# Patient Record
Sex: Female | Born: 1995 | Hispanic: Yes | Marital: Single | Smoking: Never smoker
Health system: Southern US, Community
[De-identification: ages and names within clinical notes are randomized; demographics above are authoritative.]

## PROBLEM LIST (undated history)

## (undated) HISTORY — PX: NO PAST SURGERIES: SHX2092

---

## 2020-10-24 ENCOUNTER — Inpatient Hospital Stay (HOSPITAL_COMMUNITY): Payer: Medicaid Other

## 2020-10-24 ENCOUNTER — Inpatient Hospital Stay (HOSPITAL_COMMUNITY)
Admission: AD | Admit: 2020-10-24 | Discharge: 2020-10-24 | Disposition: A | Discharge status: Home / Self Care | Payer: Medicaid Other | Attending: Obstetrics and Gynecology | Admitting: Obstetrics and Gynecology

## 2020-10-24 ENCOUNTER — Other Ambulatory Visit: Payer: Self-pay

## 2020-10-24 ENCOUNTER — Encounter (HOSPITAL_COMMUNITY): Payer: Self-pay | Admitting: Obstetrics and Gynecology

## 2020-10-24 DIAGNOSIS — O209 Hemorrhage in early pregnancy, unspecified: Secondary | ICD-10-CM

## 2020-10-24 DIAGNOSIS — O208 Other hemorrhage in early pregnancy: Secondary | ICD-10-CM | POA: Diagnosis not present

## 2020-10-24 DIAGNOSIS — Z3A12 12 weeks gestation of pregnancy: Secondary | ICD-10-CM | POA: Insufficient documentation

## 2020-10-24 DIAGNOSIS — O3680X0 Pregnancy with inconclusive fetal viability, not applicable or unspecified: Secondary | ICD-10-CM

## 2020-10-24 DIAGNOSIS — O4691 Antepartum hemorrhage, unspecified, first trimester: Secondary | ICD-10-CM | POA: Diagnosis not present

## 2020-10-24 LAB — CBC
HCT: 45.3 % (ref 36.0–46.0)
Hemoglobin: 15.1 g/dL — ABNORMAL HIGH (ref 12.0–15.0)
MCH: 30.7 pg (ref 26.0–34.0)
MCHC: 33.3 g/dL (ref 30.0–36.0)
MCV: 92.1 fL (ref 80.0–100.0)
Platelets: 261 10*3/uL (ref 150–400)
RBC: 4.92 MIL/uL (ref 3.87–5.11)
RDW: 13.2 % (ref 11.5–15.5)
WBC: 9.5 10*3/uL (ref 4.0–10.5)
nRBC: 0 % (ref 0.0–0.2)

## 2020-10-24 LAB — ABO/RH: ABO/RH(D): O POS

## 2020-10-24 LAB — HCG, QUANTITATIVE, PREGNANCY: hCG, Beta Chain, Quant, S: 1774 m[IU]/mL — ABNORMAL HIGH

## 2020-10-24 LAB — POCT PREGNANCY, URINE: Preg Test, Ur: POSITIVE — AB

## 2020-10-24 NOTE — MAU Provider Note (Signed)
History     CSN: [id number]  Arrival date and time: 10/24/20 1250  Event Date/Time  First Provider Initiated Contact with Patient 10/24/20 1715      Chief Complaint  Patient presents with   Vaginal Bleeding   HPI Robin Zhang is a 25 y.o. G1P0 at [redacted]w[redacted]d by certain LMP who presents to MAU with chief complaint of vaginal bleeding. This is a new problem, onset today. Patient reports seeing blood fill up her toilet when she voided. She denies ongoing vaginal bleeding but continued to see blood on her toilet paper. Pain score 0/10. She is remote from sexual intercourse.  OB History     Gravida  1   Para      Term      Preterm      AB      Living         SAB      IAB      Ectopic      Multiple      Live Births              Allergies: Not on File  No medications prior to admission.    Review of Systems  Genitourinary:  Positive for vaginal bleeding.  All other systems reviewed and are negative. Physical Exam   Blood pressure (!) 112/57, pulse 93, temperature 98 F (36.7 C), temperature source Oral, resp. rate 16, height 5\' 3"  (1.6 m), weight 57.3 kg, last menstrual period 07/31/2020, SpO2 99 %.  Physical Exam Vitals and nursing note reviewed. Exam conducted with a chaperone present.  Constitutional:      General: She is not in acute distress.    Appearance: Normal appearance. She is not ill-appearing.  Cardiovascular:     Rate and Rhythm: Normal rate.     Pulses: Normal pulses.     Heart sounds: Normal heart sounds.  Pulmonary:     Effort: Pulmonary effort is normal.     Breath sounds: Normal breath sounds.  Abdominal:     Tenderness: There is no abdominal tenderness.  Skin:    Capillary Refill: Capillary refill takes less than 2 seconds.  Neurological:     Mental Status: She is alert and oriented to person, place, and time.  Psychiatric:        Mood and Affect: Mood normal.        Behavior: Behavior normal.        Thought Content: Thought  content normal.        Judgment: Judgment normal.    MAU Course  Procedures  Orders Placed This Encounter  Procedures   09/30/2020 OB Comp Less 14 Wks   US OB Transvaginal   US OB Transvaginal   CBC   hCG, quantitative, pregnancy   Pregnancy, urine POC   ABO/Rh   Discharge patient   Patient Vitals for the past 24 hrs:  BP Temp Temp src Pulse Resp SpO2 Height Weight  10/24/20 1326 (!) 112/57 98 F (36.7 C) Oral 93 16 99 % -- --  10/24/20 1321 -- -- -- -- -- -- 5\' 3"  (1.6 m) 57.3 kg   Results for orders placed or performed during the hospital encounter of 10/24/20 (from the past 24 hour(s))  Pregnancy, urine POC     Status: Abnormal   Collection Time: 10/24/20  1:23 PM  Result Value Ref Range   Preg Test, Ur POSITIVE (A) NEGATIVE  CBC     Status: Abnormal   Collection Time: 10/24/20  1:48 PM  Result Value Ref Range   WBC 9.5 4.0 - 10.5 K/uL   RBC 4.92 3.87 - 5.11 MIL/uL   Hemoglobin 15.1 (H) 12.0 - 15.0 g/dL   HCT 44.0 34.7 - 42.5 %   MCV 92.1 80.0 - 100.0 fL   MCH 30.7 26.0 - 34.0 pg   MCHC 33.3 30.0 - 36.0 g/dL   RDW 95.6 38.7 - 56.4 %   Platelets 261 150 - 400 K/uL   nRBC 0.0 0.0 - 0.2 %  hCG, quantitative, pregnancy     Status: Abnormal   Collection Time: 10/24/20  1:48 PM  Result Value Ref Range   hCG, Beta Chain, Quant, S 1,774 (H) <5 mIU/mL  ABO/Rh     Status: None   Collection Time: 10/24/20  1:48 PM  Result Value Ref Range   ABO/RH(D) O POS    No rh immune globuloin      NOT A RH IMMUNE GLOBULIN CANDIDATE, PT RH POSITIVE Performed at [facility], [address] [place], [place] [address]    [place] Comp Less 14 Wks  Result Date: 10/24/2020 CLINICAL DATA:  Vaginal bleeding in 1st trimester pregnancy. EXAM: OBSTETRIC <14 WK [place] AND TRANSVAGINAL OB US TECHNIQUE: Both transabdominal and transvaginal ultrasound examinations were performed for complete evaluation of the gestation as well as the maternal uterus, adnexal regions, and pelvic cul-de-sac. Transvaginal  technique was performed to assess early pregnancy. COMPARISON:  None. FINDINGS: Intrauterine gestational sac: Single, with irregular shape and location in lower uterine segment noted Yolk sac:  Visualized. Embryo:  Visualized. Cardiac Activity: Not Visualized. CRL:  6 mm   6 w   2 d                  [place] EDC: 06/17/2021 Subchorionic hemorrhage:  Small subchorionic hemorrhage seen. Maternal uterus/adnexae: Uterus is retroflexed. Both ovaries are normal in appearance. No mass or abnormal free fluid identified. IMPRESSION: Findings are suspicious but not yet definitive for failed pregnancy. Recommend follow-up US in 7 days for definitive diagnosis. This recommendation follows SRU consensus guidelines: Diagnostic Criteria for Nonviable Pregnancy Early in the First Trimester. Malva Limes Med 2013; 188:4166-06. Electronically Signed   By: Danae Orleans M.D.   On: 10/24/2020 16:59   US OB Transvaginal  Result Date: 10/24/2020 CLINICAL DATA:  Vaginal bleeding in 1st trimester pregnancy. EXAM: OBSTETRIC <14 WK [place] AND TRANSVAGINAL OB US TECHNIQUE: Both transabdominal and transvaginal ultrasound examinations were performed for complete evaluation of the gestation as well as the maternal uterus, adnexal regions, and pelvic cul-de-sac. Transvaginal technique was performed to assess early pregnancy. COMPARISON:  None. FINDINGS: Intrauterine gestational sac: Single, with irregular shape and location in lower uterine segment noted Yolk sac:  Visualized. Embryo:  Visualized. Cardiac Activity: Not Visualized. CRL:  6 mm   6 w   2 d                  [place] EDC: 06/17/2021 Subchorionic hemorrhage:  Small subchorionic hemorrhage seen. Maternal uterus/adnexae: Uterus is retroflexed. Both ovaries are normal in appearance. No mass or abnormal free fluid identified. IMPRESSION: Findings are suspicious but not yet definitive for failed pregnancy. Recommend follow-up US in 7 days for definitive diagnosis. This recommendation follows SRU consensus  guidelines: Diagnostic Criteria for Nonviable Pregnancy Early in the First Trimester. Malva Limes Med 2013; 301:6010-93. Electronically Signed   By: Danae Orleans M.D.   On: 10/24/2020 16:59    Assessment and Plan  --  26 y.o. G1P0 at [redacted]w[redacted]d --Concern for pregnancy failure --Hgb 15.1 --Quant hCG 1774 --Blood type O POS --Discharge home in stable condition with bleeding precautions  F/U: --Message sent to [facility] to schedule repeat viability scan in one week  Calvert Cantor, CNM 10/24/2020, 8:05 PM

## 2020-10-24 NOTE — MAU Note (Signed)
Robin Zhang is a 25 y.o. at [redacted]w[redacted]d here in MAU reporting: this AM when she woke up she saw some bleeding in the toilet bowl. Did not see any bleeding when using the bathroom in MAU. No pain. No abnormal discharge. Last IC was last night.  LMP: 07/31/20  Onset of complaint: today  Pain score: 0/10  Vitals:   10/24/20 1326  BP: (!) 112/57  Pulse: 93  Resp: 16  Temp: 98 F (36.7 C)  SpO2: 99%     EEF:[id number]  Lab orders placed from triage: upt

## 2020-11-01 ENCOUNTER — Ambulatory Visit
Admission: RE | Admit: 2020-11-01 | Discharge: 2020-11-01 | Disposition: A | Discharge status: Home / Self Care | Payer: Medicaid Other | Source: Ambulatory Visit | Attending: Advanced Practice Midwife | Admitting: Advanced Practice Midwife

## 2020-11-01 ENCOUNTER — Ambulatory Visit (INDEPENDENT_AMBULATORY_CARE_PROVIDER_SITE_OTHER): Payer: Self-pay | Admitting: Family Medicine

## 2020-11-01 ENCOUNTER — Other Ambulatory Visit: Payer: Self-pay

## 2020-11-01 ENCOUNTER — Other Ambulatory Visit (HOSPITAL_COMMUNITY): Payer: Self-pay

## 2020-11-01 VITALS — BP 93/68 | HR 87 | Temp 97.6°F | Wt 125.9 lb

## 2020-11-01 DIAGNOSIS — O3680X0 Pregnancy with inconclusive fetal viability, not applicable or unspecified: Secondary | ICD-10-CM | POA: Insufficient documentation

## 2020-11-01 DIAGNOSIS — O021 Missed abortion: Secondary | ICD-10-CM

## 2020-11-01 MED ORDER — OXYCODONE-ACETAMINOPHEN 5-325 MG PO TABS
1.0000 | ORAL_TABLET | ORAL | 0 refills | Status: DC | PRN
  Filled 2020-11-01: qty 20, 4d supply, fill #0

## 2020-11-01 MED ORDER — MISOPROSTOL 200 MCG PO TABS
ORAL_TABLET | ORAL | 1 refills | Status: DC
  Filled 2020-11-01: qty 4, 1d supply, fill #0

## 2020-11-01 NOTE — Progress Notes (Signed)
Reports vaginal bleeding like a period for 5 days following visit to MAU; bleeding for less 3 days has been lighter. Denies any pain since MAU visit.   Fleet Contras RN 11/01/20

## 2020-11-01 NOTE — Progress Notes (Signed)
   Subjective:    Patient ID: Robin Zhang, female    DOB: 03-02-1996, 25 y.o.   MRN: [id number]  HPI Patient seen for follow up US. Her last [place] was last week and showed a embryo without a heartbeat. She did have some bleeding like a period a few days ago, then no further bleeding.   Review of Systems     Objective:   Physical Exam Vitals reviewed.  Constitutional:      Appearance: Normal appearance.  Cardiovascular:     Rate and Rhythm: Normal rate.  Pulmonary:     Effort: Pulmonary effort is normal.  Abdominal:     General: Abdomen is flat.     Palpations: Abdomen is soft.  Neurological:     Mental Status: She is alert.  Psychiatric:        Mood and Affect: Mood normal.        Behavior: Behavior normal.        Thought Content: Thought content normal.        Judgment: Judgment normal.       Assessment & Plan:   1. Missed abortion Discussed results with the patient and next steps. Will prescribe cytotec. Discussed how to take, when to seek care. May repeat in 24-48 hours. F/u in 2 weeks.

## 2020-11-15 ENCOUNTER — Encounter: Payer: Self-pay | Admitting: Obstetrics and Gynecology

## 2020-11-15 ENCOUNTER — Ambulatory Visit: Payer: Self-pay | Admitting: Obstetrics and Gynecology

## 2020-11-21 ENCOUNTER — Encounter: Payer: Self-pay | Admitting: Nurse Practitioner

## 2020-11-21 ENCOUNTER — Other Ambulatory Visit: Payer: Self-pay

## 2020-11-21 ENCOUNTER — Ambulatory Visit (INDEPENDENT_AMBULATORY_CARE_PROVIDER_SITE_OTHER): Payer: Medicaid Other | Admitting: Nurse Practitioner

## 2020-11-21 VITALS — BP 97/65 | HR 91 | Ht 62.0 in | Wt 126.7 lb

## 2020-11-21 DIAGNOSIS — Z3202 Encounter for pregnancy test, result negative: Secondary | ICD-10-CM

## 2020-11-21 DIAGNOSIS — N939 Abnormal uterine and vaginal bleeding, unspecified: Secondary | ICD-10-CM

## 2020-11-21 DIAGNOSIS — O039 Complete or unspecified spontaneous abortion without complication: Secondary | ICD-10-CM | POA: Diagnosis not present

## 2020-11-21 LAB — POCT PREGNANCY, URINE: Preg Test, Ur: POSITIVE — AB

## 2020-11-21 NOTE — Progress Notes (Signed)
GYNECOLOGY OFFICE VISIT NOTE   History:  25 y.o. G1P0 here today for follow up for recent miscarriage.  She took Cytotec around 11-02-20 and reports having a large piece of tissue come out.  She has also had unprotected intercourse several times.  She stopped bleeding and then continues to have bleeding again.  She is wondering when the bleeding will stop.  Marland KitchenNo past medical history on file.  The following portions of the patient's history were reviewed and updated as appropriate: allergies, current medications, past family history, past medical history, past social history, past surgical history and problem list.      Review of Systems:  Pertinent items noted in HPI and remainder of comprehensive ROS otherwise negative.  Objective:  Physical Exam BP 97/65   Pulse 91   Ht 5\' 2"  (1.575 m)   Wt 126 lb 11.2 oz (57.5 kg)   LMP 07/31/2020 Comment: SAB  Breastfeeding Unknown   BMI 23.17 kg/m  CONSTITUTIONAL: Well-developed, well-nourished female in no acute distress.  HENT:  Normocephalic, atraumatic. External right and left ear normal.  EYES: Conjunctivae and EOM are normal. Pupils are equal, round.  No scleral icterus.  NECK: Normal range of motion, supple, no masses SKIN: Skin is warm and dry. No rash noted. Not diaphoretic. No erythema. No pallor. NEUROLOGIC: Alert and oriented to person, place, and time. Normal muscle tone coordination. No cranial nerve deficit noted. PSYCHIATRIC: Normal mood and affect. Normal behavior. Normal judgment and thought content. CARDIOVASCULAR: Normal heart rate noted RESPIRATORY: Effort and breath sounds normal, no problems with respiration noted ABDOMEN: Soft, no distention noted.   PELVIC: Deferred MUSCULOSKELETAL: Normal range of motion. No edema noted.  Labs and Imaging 09/30/2020 OB Comp Less 14 Wks  Result Date: 10/24/2020 CLINICAL DATA:  Vaginal bleeding in 1st trimester pregnancy. EXAM: OBSTETRIC <14 WK 12/25/2020 AND TRANSVAGINAL OB US TECHNIQUE: Both  transabdominal and transvaginal ultrasound examinations were performed for complete evaluation of the gestation as well as the maternal uterus, adnexal regions, and pelvic cul-de-sac. Transvaginal technique was performed to assess early pregnancy. COMPARISON:  None. FINDINGS: Intrauterine gestational sac: Single, with irregular shape and location in lower uterine segment noted Yolk sac:  Visualized. Embryo:  Visualized. Cardiac Activity: Not Visualized. CRL:  6 mm   6 w   2 d                  [place] EDC: 06/17/2021 Subchorionic hemorrhage:  Small subchorionic hemorrhage seen. Maternal uterus/adnexae: Uterus is retroflexed. Both ovaries are normal in appearance. No mass or abnormal free fluid identified. IMPRESSION: Findings are suspicious but not yet definitive for failed pregnancy. Recommend follow-up 06/19/2021 in 7 days for definitive diagnosis. This recommendation follows SRU consensus guidelines: Diagnostic Criteria for Nonviable Pregnancy Early in the First Trimester. [place] Med [id number]. Electronically Signed   By: ; [id number] M.D.   On: 10/24/2020 16:59   12/25/2020 OB Transvaginal  Result Date: 11/01/2020 CLINICAL DATA:  First trimester pregnancy, assessment of viability EXAM: TRANSVAGINAL OB ULTRASOUND TECHNIQUE: Transvaginal ultrasound was performed for complete evaluation of the gestation as well as the maternal uterus, adnexal regions, and pelvic cul-de-sac. COMPARISON:  10/24/2020 FINDINGS: Intrauterine gestational sac: Present, single, markedly irregular, located at lower uterine segment and containing scattered internal echoes and question debris Yolk sac:  Absent Embryo:  Not definitely visualize Cardiac Activity: N/A Heart Rate: N/A bpm MSD: 18.0 mm   6 w   5 d CRL:     mm  w  d                  [place] EDC: Subchorionic hemorrhage:  Moderate subchorionic hemorrhage Maternal uterus/adnexae: RIGHT ovary normal size and morphology, 3.4 x 1.7 x 2.1 cm. LEFT ovary not visualized. Uterus retroflexed, patient  with prior Caesarean section but scar not definitely localized. No free pelvic fluid or adnexal masses. IMPRESSION: Irregular gestational sac seen within the uterus at the lower uterine segment, containing scattered internal echoes/debris. Fetal pole seen on the previous exam is not definitely visualized, question now absent versus obscured by material within gestational sac. Moderate subchorionic hemorrhage. Gestational sac is located near the expected position of the Caesarean section scar, though scar is not well visualized; recommend attention on follow-up imaging to exclude scar ectopic pregnancy. Findings are suspicious but not yet definitive for failed pregnancy. Recommend follow-up US in 10-14 days for definitive diagnosis. This recommendation follows SRU consensus guidelines: Diagnostic Criteria for Nonviable Pregnancy Early in the First Trimester. Malva Limes Med 2013; 536:6440-34. Electronically Signed   By: Ulyses Southward M.D.   On: 11/01/2020 14:40   US OB Transvaginal  Result Date: 10/24/2020 CLINICAL DATA:  Vaginal bleeding in 1st trimester pregnancy. EXAM: OBSTETRIC <14 WK [place] AND TRANSVAGINAL OB US TECHNIQUE: Both transabdominal and transvaginal ultrasound examinations were performed for complete evaluation of the gestation as well as the maternal uterus, adnexal regions, and pelvic cul-de-sac. Transvaginal technique was performed to assess early pregnancy. COMPARISON:  None. FINDINGS: Intrauterine gestational sac: Single, with irregular shape and location in lower uterine segment noted Yolk sac:  Visualized. Embryo:  Visualized. Cardiac Activity: Not Visualized. CRL:  6 mm   6 w   2 d                  [place] EDC: 06/17/2021 Subchorionic hemorrhage:  Small subchorionic hemorrhage seen. Maternal uterus/adnexae: Uterus is retroflexed. Both ovaries are normal in appearance. No mass or abnormal free fluid identified. IMPRESSION: Findings are suspicious but not yet definitive for failed pregnancy. Recommend  follow-up US in 7 days for definitive diagnosis. This recommendation follows SRU consensus guidelines: Diagnostic Criteria for Nonviable Pregnancy Early in the First Trimester. Malva Limes Med 2013; 742:5956-38. Electronically Signed   By: Danae Orleans M.D.   On: 10/24/2020 16:59    Assessment & Plan:  1. Miscarriage Took cytotec as ordered and reports passing a clump of tissue different from bleeding.  Has continued to have unprotected intercourse since the miscarriage Did a urine pregnancy test today that was faintly positive at approx 3 weeks post cytotec.  Chart reports she no insurance so urine pregnancy test (over quant) was done as she was still having periodic bleeding sometimes after intercourse.   Discussed that urine pregnancy test was faintly positive and she has no pain today.  Thinking this is the pregnancy still resolving (quant on 10-24-20 was 1700).   Advised to return if having pain.  Unlikely a new pregnancy that would be ectopic at this time, but will need evaluation if having pain. Advised condoms if having intercourse. Was taking pills before and wants to restart pills.  With uncertainty regarding whether she is newly pregnant after miscarriage - not likely but has been having unprotected intercourse - will wait until next visit to restart pills.  2. Vaginal bleeding  - Pregnancy, urine   Routine preventative health maintenance measures emphasized. Please refer to After Visit Summary for other counseling recommendations.   Return in about 2 weeks (  around 12/05/2020) for Provider visit for urine pregnancy test and birth control pill prescription.   Total face-to-face time with patient:  10  minutes.  Over 50% of encounter was spent on counseling and coordination of care.  Nolene Bernheim, RN, MSN, NP-BC Nurse Practitioner, [facility] for [employer], [facility] 11/21/2020 2:43 PM

## 2020-11-21 NOTE — Progress Notes (Signed)
Pt states is still having some light to moderate bleeding.

## 2020-12-11 ENCOUNTER — Other Ambulatory Visit (HOSPITAL_COMMUNITY): Payer: Self-pay

## 2020-12-11 ENCOUNTER — Other Ambulatory Visit: Payer: Self-pay

## 2020-12-11 ENCOUNTER — Ambulatory Visit (INDEPENDENT_AMBULATORY_CARE_PROVIDER_SITE_OTHER): Payer: Medicaid Other | Admitting: Obstetrics & Gynecology

## 2020-12-11 VITALS — BP 100/75 | HR 81 | Wt 128.5 lb

## 2020-12-11 DIAGNOSIS — O039 Complete or unspecified spontaneous abortion without complication: Secondary | ICD-10-CM | POA: Diagnosis not present

## 2020-12-11 DIAGNOSIS — Z30011 Encounter for initial prescription of contraceptive pills: Secondary | ICD-10-CM

## 2020-12-11 LAB — POCT PREGNANCY, URINE: Preg Test, Ur: NEGATIVE

## 2020-12-11 MED ORDER — NORGESTIM-ETH ESTRAD TRIPHASIC 0.18/0.215/0.25 MG-25 MCG PO TABS
1.0000 | ORAL_TABLET | Freq: Every day | ORAL | 11 refills | Status: DC
  Filled 2020-12-11: qty 28, 28d supply, fill #0

## 2020-12-11 NOTE — Progress Notes (Signed)
Patient ID: Robin Zhang, female   DOB: [birth year], 25 y.o.   MRN: [id number]  Chief Complaint  Patient presents with   Follow-up    HPI Robin Zhang is a 25 y.o. female.  F/u for OCP after having miscarriage and treatment with Cytotec. She still has intermittent bleeding with clots HPI  No past medical history on file.  No past surgical history on file.  No family history on file.  Social History    Not on File  Current Outpatient Medications  Medication Sig Dispense Refill   Norgestimate-Ethinyl Estradiol Triphasic 0.18/0.215/0.25 MG-25 MCG tab Take 1 tablet by mouth daily. 28 tablet 11   No current facility-administered medications for this visit.    Review of Systems Review of Systems  Constitutional: Negative.   Gastrointestinal: Negative.   Genitourinary:  Positive for vaginal bleeding. Negative for pelvic pain.   Blood pressure 100/75, pulse 81, weight 128 lb 8 oz (58.3 kg), last menstrual period 07/31/2020, not currently breastfeeding.  Physical Exam Physical Exam Vitals and nursing note reviewed.  Constitutional:      Appearance: Normal appearance.  Pulmonary:     Effort: Pulmonary effort is normal.  Abdominal:     General: Abdomen is flat.     Palpations: Abdomen is soft.  Neurological:     Mental Status: She is alert.  Psychiatric:        Mood and Affect: Mood normal.        Behavior: Behavior normal.    Data Reviewed [place] results and office notes  Assessment Probable s/p complete Sab but still has DUB  Plan F/u pelvic US to check for retained POC Rx TriCyclen given    Scheryl Darter 12/11/2020, 10:03 AM

## 2020-12-11 NOTE — Progress Notes (Signed)
Patient is here for follow up from miscarriage that happened July 5th, 2022. Patient stated that she is not in any pain but she is still bleeding. Ms. Zechman would like to start birth control pills today.

## 2020-12-18 ENCOUNTER — Other Ambulatory Visit: Payer: Self-pay

## 2020-12-18 ENCOUNTER — Ambulatory Visit (INDEPENDENT_AMBULATORY_CARE_PROVIDER_SITE_OTHER): Payer: Medicaid Other | Admitting: General Practice

## 2020-12-18 ENCOUNTER — Ambulatory Visit
Admission: RE | Admit: 2020-12-18 | Discharge: 2020-12-18 | Disposition: A | Discharge status: Home / Self Care | Payer: Medicaid Other | Source: Ambulatory Visit | Attending: Obstetrics & Gynecology | Admitting: Obstetrics & Gynecology

## 2020-12-18 ENCOUNTER — Telehealth: Payer: Self-pay | Admitting: General Practice

## 2020-12-18 DIAGNOSIS — O034 Incomplete spontaneous abortion without complication: Secondary | ICD-10-CM

## 2020-12-18 DIAGNOSIS — Z712 Person consulting for explanation of examination or test findings: Secondary | ICD-10-CM

## 2020-12-18 DIAGNOSIS — O039 Complete or unspecified spontaneous abortion without complication: Secondary | ICD-10-CM | POA: Diagnosis not present

## 2020-12-18 NOTE — Telephone Encounter (Signed)
Per Dr Debroah Loop, Abnormal US findings after miscarriage needs to be seen in the office 1-2 days for HCG and management plan. Called patient with pacific interpreter [phone]. Discussed with patient ultrasound results may show retained products from miscarriage & Dr Debroah Loop would like for her to see a provider in the next day or so and to have blood work to check pregnancy hormone levels. Patient verbalized understanding. Offered appt tomorrow at 9:30 for stat bhcg and MD appt at 1:30. Patient verbalized understanding and will be here then.

## 2020-12-18 NOTE — Progress Notes (Signed)
Patient was taken downstairs to our office following abnormal ultrasound results. Ultrasound results were pending for around a half hour and Dr Debroah Loop was not in office today. Told patient I would call her back this afternoon with results once the doctor had reviewed them. Patient verbalized understanding.  Chase Caller RN BSN 12/18/20

## 2020-12-19 ENCOUNTER — Telehealth: Payer: Self-pay

## 2020-12-19 ENCOUNTER — Encounter: Payer: Self-pay | Admitting: Family Medicine

## 2020-12-19 ENCOUNTER — Other Ambulatory Visit (HOSPITAL_COMMUNITY): Payer: Self-pay

## 2020-12-19 ENCOUNTER — Other Ambulatory Visit: Payer: Self-pay

## 2020-12-19 ENCOUNTER — Encounter (HOSPITAL_BASED_OUTPATIENT_CLINIC_OR_DEPARTMENT_OTHER): Payer: Self-pay | Admitting: Obstetrics & Gynecology

## 2020-12-19 ENCOUNTER — Telehealth: Payer: Self-pay | Admitting: *Deleted

## 2020-12-19 ENCOUNTER — Ambulatory Visit (INDEPENDENT_AMBULATORY_CARE_PROVIDER_SITE_OTHER): Payer: Medicaid Other | Admitting: Family Medicine

## 2020-12-19 ENCOUNTER — Other Ambulatory Visit: Payer: Medicaid Other

## 2020-12-19 DIAGNOSIS — O034 Incomplete spontaneous abortion without complication: Secondary | ICD-10-CM

## 2020-12-19 LAB — BETA HCG QUANT (REF LAB): hCG Quant: 23 m[IU]/mL

## 2020-12-19 NOTE — Progress Notes (Addendum)
GYNECOLOGY OFFICE VISIT NOTE  History:   Robin Zhang is a 25 y.o. [id number] here today for follow up of [place].  Patient seen in MAU in early 10/2020 Diagnosed with early IUP, uncertain viability  Repeat US on 11/01/20 c/w miscarriage, given cytotec Returned to clinic last week reporting ongoing bleeding Went to have [place] yesterday which showed hypervascular 4cm structure in endometrium Hcg obtained today was 23  Today reports no abdominal pain but ongoing bleeding Occasional clots No fevers  Health Maintenance Due  Topic Date Due   HPV VACCINES (1 - 2-dose series) Never done   HIV Screening  Never done   Hepatitis C Screening  Never done   TETANUS/TDAP  Never done   INFLUENZA VACCINE  11/20/2020    History reviewed. No pertinent past medical history.  History reviewed. No pertinent surgical history.  The following portions of the patient's history were reviewed and updated as appropriate: allergies, current medications, past family history, past medical history, past social history, past surgical history and problem list.   Health Maintenance:   Last pap: No results found for: DIAGPAP, HPV, HPVHIGH   Last mammogram:  N/a    Review of Systems:  Pertinent items noted in HPI and remainder of comprehensive ROS otherwise negative.  Physical Exam:  BP 108/68   Pulse 86   Wt 127 lb 8 oz (57.8 kg)   BMI 23.32 kg/m  CONSTITUTIONAL: Well-developed, well-nourished female in no acute distress.  HEENT:  Normocephalic, atraumatic. External right and left ear normal. No scleral icterus.  NECK: Normal range of motion, supple, no masses noted on observation SKIN: No rash noted. Not diaphoretic. No erythema. No pallor. MUSCULOSKELETAL: Normal range of motion. No edema noted. NEUROLOGIC: Alert and oriented to person, place, and time. Normal muscle tone coordination.  PSYCHIATRIC: Normal mood and affect. Normal behavior. Normal judgment and thought content. RESPIRATORY: Effort normal,  no problems with respiration noted ABDOMEN: No masses noted. No other overt distention noted.    Labs and Imaging Results for orders placed or performed in visit on 12/19/20 (from the past 168 hour(s))  Beta hCG quant (ref lab)   Collection Time: 12/19/20  9:41 AM  Result Value Ref Range   hCG Quant 23 mIU/mL   US OB Transvaginal  Result Date: 12/18/2020 CLINICAL DATA:  Dysfunctional uterine bleeding following recent miscarriage in July of 2022 EXAM: TRANSVAGINAL OB ULTRASOUND TECHNIQUE: Transvaginal ultrasound was performed for complete evaluation of the gestation as well as the maternal uterus, adnexal regions, and pelvic cul-de-sac. COMPARISON:  Ultrasound 10/24/2020, 11/01/2020 FINDINGS: Intrauterine gestational sac: Previously seen gestational sac is no longer visualized. Maternal uterus/adnexae: There is a new complex heterogeneous mass along the left side of the uterus which appears to be arising from the endometrial stripe measuring approximately 4.4 x 4.3 x 4.4 cm. This area is markedly hypervascular with prominent vascular channels. Elongated fluid structures within the right ovary which may represent right uterine cysts or possibly reflect dilated fallopian tube adjacent to the right ovary. No internal complexity. Left ovary was not visualized. Trace free fluid within the cul-de-sac. IMPRESSION: 1. No intrauterine pregnancy identified. 2. There is a new 4.4 cm hypervascular mass within the uterus adjacent to the endometrial stripe. Findings may represent retained products of conception or possibly a molar pregnancy. Correlation with serial beta HCG levels, short-term follow-up ultrasound, and close OB follow-up is recommended. 3. Right adnexal cysts or possibly hydrosalpinx. Attention on follow-up. These results will be called to the ordering clinician or  representative by the Radiologist Assistant, and communication documented in the PACS or [employer]. Electronically Signed   By:  Duanne Guess D.O.   On: 12/18/2020 12:13      Assessment and Plan:   Problem List Items Addressed This Visit       Other   Retained products of conception after delivery without hemorrhage    Imaging c/f retained POC. Discussed D&C with patient, she is amenable to this plan. Discussed risks of bleeding, infection, uterine perforation, inability to fully evacuate contents. Discussed contraception, planning on OCP's, not interested in IUD. Discussed with Dr. Debroah Loop who is in agreement with this plan. Message sent to schedule. Reviewed warning signs of heavy bleeding, severe abdominal pain, fever, that would prompt MAU visit.        Routine preventative health maintenance measures emphasized. Please refer to After Visit Summary for other counseling recommendations.   Return if symptoms worsen or fail to improve.    Total face-to-face time with patient: 15 minutes.  Over 50% of encounter was spent on counseling and coordination of care.   Venora Maples, MD/MPH Attending Family Medicine Physician, [facility] for [facility], [facility]

## 2020-12-19 NOTE — Assessment & Plan Note (Addendum)
Imaging c/f retained POC. Discussed D&C with patient, she is amenable to this plan. Discussed risks of bleeding, infection, uterine perforation, inability to fully evacuate contents. Discussed contraception, planning on OCP's, not interested in IUD. Discussed with Dr. Debroah Loop who is in agreement with this plan. Message sent to schedule. Reviewed warning signs of heavy bleeding, severe abdominal pain, fever, that would prompt MAU visit.

## 2020-12-19 NOTE — Telephone Encounter (Signed)
[facility] to patient with [place] [facility]pre[facility] [phone].  Advised surgery scheduled for 12-21-20, Thursday, at 12 noon, arrive 10am. Address provided and advised will receive call with additional instructions.  Encounter closed.

## 2020-12-19 NOTE — Telephone Encounter (Signed)
[facility] STAT lab called with beta HCG result of 23. This will be reviewed with pt at provider visit this afternoon.

## 2020-12-20 ENCOUNTER — Other Ambulatory Visit: Payer: Self-pay | Admitting: Obstetrics & Gynecology

## 2020-12-20 NOTE — Progress Notes (Signed)
Orders for surgery 

## 2020-12-21 ENCOUNTER — Other Ambulatory Visit (HOSPITAL_COMMUNITY): Payer: Self-pay

## 2020-12-21 ENCOUNTER — Ambulatory Visit (HOSPITAL_BASED_OUTPATIENT_CLINIC_OR_DEPARTMENT_OTHER)
Admission: RE | Admit: 2020-12-21 | Discharge: 2020-12-21 | Disposition: A | Discharge status: Home / Self Care | Payer: Medicaid Other | Attending: Obstetrics & Gynecology | Admitting: Obstetrics & Gynecology

## 2020-12-21 ENCOUNTER — Encounter (HOSPITAL_BASED_OUTPATIENT_CLINIC_OR_DEPARTMENT_OTHER)
Admission: RE | Disposition: A | Discharge status: Home / Self Care | Payer: Self-pay | Source: Home / Self Care | Attending: Obstetrics & Gynecology

## 2020-12-21 ENCOUNTER — Ambulatory Visit (HOSPITAL_BASED_OUTPATIENT_CLINIC_OR_DEPARTMENT_OTHER): Payer: Medicaid Other | Admitting: Anesthesiology

## 2020-12-21 ENCOUNTER — Other Ambulatory Visit: Payer: Self-pay

## 2020-12-21 ENCOUNTER — Encounter (HOSPITAL_BASED_OUTPATIENT_CLINIC_OR_DEPARTMENT_OTHER): Payer: Self-pay | Admitting: Obstetrics & Gynecology

## 2020-12-21 DIAGNOSIS — O034 Incomplete spontaneous abortion without complication: Secondary | ICD-10-CM | POA: Insufficient documentation

## 2020-12-21 HISTORY — PX: DILATION AND EVACUATION: SHX1459

## 2020-12-21 SURGERY — DILATION AND EVACUATION, UTERUS
Anesthesia: General | Site: Vagina

## 2020-12-21 MED ORDER — PROPOFOL 10 MG/ML IV BOLUS
INTRAVENOUS | Status: AC
  Filled 2020-12-21: qty 20

## 2020-12-21 MED ORDER — MIDAZOLAM HCL 5 MG/5ML IJ SOLN
INTRAMUSCULAR | Status: DC | PRN
  Administered 2020-12-21: 2 mg via INTRAVENOUS

## 2020-12-21 MED ORDER — SILVER NITRATE-POT NITRATE 75-25 % EX MISC
CUTANEOUS | Status: AC
  Filled 2020-12-21: qty 10

## 2020-12-21 MED ORDER — PROPOFOL 10 MG/ML IV BOLUS
INTRAVENOUS | Status: DC | PRN
  Administered 2020-12-21: 120 mg via INTRAVENOUS
  Administered 2020-12-21: 80 mg via INTRAVENOUS

## 2020-12-21 MED ORDER — FENTANYL CITRATE (PF) 100 MCG/2ML IJ SOLN
INTRAMUSCULAR | Status: DC | PRN
  Administered 2020-12-21 (×2): 50 ug via INTRAVENOUS

## 2020-12-21 MED ORDER — ONDANSETRON HCL 4 MG/2ML IJ SOLN
INTRAMUSCULAR | Status: AC
  Filled 2020-12-21: qty 2

## 2020-12-21 MED ORDER — SCOPOLAMINE 1 MG/3DAYS TD PT72: 1.0000 | MEDICATED_PATCH | Freq: Once | TRANSDERMAL | Status: DC

## 2020-12-21 MED ORDER — BUPIVACAINE HCL (PF) 0.25 % IJ SOLN
INTRAMUSCULAR | Status: DC | PRN
  Administered 2020-12-21: 10 mL

## 2020-12-21 MED ORDER — CARBOPROST TROMETHAMINE 250 MCG/ML IM SOLN
INTRAMUSCULAR | Status: AC
  Filled 2020-12-21: qty 1

## 2020-12-21 MED ORDER — ONDANSETRON HCL 4 MG/2ML IJ SOLN
INTRAMUSCULAR | Status: DC | PRN
  Administered 2020-12-21: 4 mg via INTRAVENOUS

## 2020-12-21 MED ORDER — ACETAMINOPHEN 500 MG PO TABS: 1000.0000 mg | ORAL_TABLET | Freq: Once | ORAL | Status: DC

## 2020-12-21 MED ORDER — CARBOPROST TROMETHAMINE 250 MCG/ML IM SOLN
INTRAMUSCULAR | Status: DC | PRN
  Administered 2020-12-21: 250 ug via INTRAMUSCULAR

## 2020-12-21 MED ORDER — PROMETHAZINE HCL 25 MG/ML IJ SOLN
6.2500 mg | INTRAMUSCULAR | Status: DC | PRN
Start: 2020-12-21 — End: 2020-12-21

## 2020-12-21 MED ORDER — LACTATED RINGERS IV SOLN: INTRAVENOUS | Status: DC

## 2020-12-21 MED ORDER — METHYLERGONOVINE MALEATE 0.2 MG/ML IJ SOLN
0.2000 mg | Freq: Once | INTRAMUSCULAR | Status: AC
  Administered 2020-12-21: 0.2 mg via INTRAMUSCULAR

## 2020-12-21 MED ORDER — MIDAZOLAM HCL 2 MG/2ML IJ SOLN
INTRAMUSCULAR | Status: AC
  Filled 2020-12-21: qty 2

## 2020-12-21 MED ORDER — FENTANYL CITRATE (PF) 100 MCG/2ML IJ SOLN
25.0000 ug | INTRAMUSCULAR | Status: DC | PRN
  Administered 2020-12-21: 50 ug via INTRAVENOUS

## 2020-12-21 MED ORDER — MISOPROSTOL 200 MCG PO TABS
ORAL_TABLET | ORAL | Status: AC
  Filled 2020-12-21: qty 5

## 2020-12-21 MED ORDER — POVIDONE-IODINE 10 % EX SWAB
2.0000 "application " | Freq: Once | CUTANEOUS | Status: AC
  Administered 2020-12-21: 2 via TOPICAL

## 2020-12-21 MED ORDER — FENTANYL CITRATE (PF) 100 MCG/2ML IJ SOLN
INTRAMUSCULAR | Status: AC
  Filled 2020-12-21: qty 2

## 2020-12-21 MED ORDER — OXYCODONE HCL 5 MG/5ML PO SOLN: 5.0000 mg | Freq: Once | ORAL | Status: DC | PRN

## 2020-12-21 MED ORDER — OXYCODONE-ACETAMINOPHEN 5-325 MG PO TABS
1.0000 | ORAL_TABLET | ORAL | 0 refills | Status: DC | PRN
  Filled 2020-12-21: qty 12, 2d supply, fill #0

## 2020-12-21 MED ORDER — PHENYLEPHRINE HCL (PRESSORS) 10 MG/ML IV SOLN
INTRAVENOUS | Status: AC
  Filled 2020-12-21: qty 2

## 2020-12-21 MED ORDER — DEXAMETHASONE SODIUM PHOSPHATE 10 MG/ML IJ SOLN
INTRAMUSCULAR | Status: AC
  Filled 2020-12-21: qty 1

## 2020-12-21 MED ORDER — OXYCODONE HCL 5 MG PO TABS: 5.0000 mg | ORAL_TABLET | Freq: Once | ORAL | Status: DC | PRN

## 2020-12-21 MED ORDER — LIDOCAINE HCL (CARDIAC) PF 100 MG/5ML IV SOSY
PREFILLED_SYRINGE | INTRAVENOUS | Status: DC | PRN
  Administered 2020-12-21: 60 mg via INTRAVENOUS

## 2020-12-21 MED ORDER — DEXAMETHASONE SODIUM PHOSPHATE 4 MG/ML IJ SOLN
INTRAMUSCULAR | Status: DC | PRN
  Administered 2020-12-21: 10 mg via INTRAVENOUS

## 2020-12-21 MED ORDER — LIDOCAINE HCL (PF) 2 % IJ SOLN
INTRAMUSCULAR | Status: AC
  Filled 2020-12-21: qty 5

## 2020-12-21 MED ORDER — SODIUM CHLORIDE 0.9 % IV SOLN
INTRAVENOUS | Status: AC
  Filled 2020-12-21: qty 100

## 2020-12-21 MED ORDER — OXYTOCIN 10 UNIT/ML IJ SOLN
INTRAMUSCULAR | Status: AC
  Filled 2020-12-21: qty 1

## 2020-12-21 MED ORDER — DOXYCYCLINE HYCLATE 100 MG IV SOLR
200.0000 mg | INTRAVENOUS | Status: AC
  Administered 2020-12-21: 200 mg via INTRAVENOUS
  Filled 2020-12-21: qty 200

## 2020-12-21 SURGICAL SUPPLY — 25 items
CATH ROBINSON RED A/P 14FR (CATHETERS) IMPLANT
DECANTER SPIKE VIAL GLASS SM (MISCELLANEOUS) IMPLANT
FILTER UTR ASPR ASSEMBLY (MISCELLANEOUS) IMPLANT
GAUZE 4X4 16PLY ~~LOC~~+RFID DBL (SPONGE) ×2 IMPLANT
GLOVE SURG ENC MOIS LTX SZ6.5 (GLOVE) ×2 IMPLANT
GLOVE SURG POLYISO LF SZ8 (GLOVE) ×2 IMPLANT
GLOVE SURG UNDER POLY LF SZ7 (GLOVE) ×4 IMPLANT
GOWN STRL REUS W/ TWL LRG LVL3 (GOWN DISPOSABLE) ×1 IMPLANT
GOWN STRL REUS W/TWL 2XL LVL3 (GOWN DISPOSABLE) ×2 IMPLANT
GOWN STRL REUS W/TWL LRG LVL3 (GOWN DISPOSABLE) ×4 IMPLANT
HIBICLENS CHG 4% 4OZ BTL (MISCELLANEOUS) IMPLANT
HOSE CONNECTING 18IN BERKELEY (TUBING) IMPLANT
KIT BERKELEY 1ST TRI 3/8 NO TR (MISCELLANEOUS) ×2 IMPLANT
KIT BERKELEY 1ST TRIMESTER 3/8 (MISCELLANEOUS) ×2 IMPLANT
NS IRRIG 1000ML POUR BTL (IV SOLUTION) ×2 IMPLANT
PACK VAGINAL MINOR WOMEN LF (CUSTOM PROCEDURE TRAY) ×2 IMPLANT
PAD OB MATERNITY 4.3X12.25 (PERSONAL CARE ITEMS) ×2 IMPLANT
PAD PREP 24X48 CUFFED NSTRL (MISCELLANEOUS) ×2 IMPLANT
SET BERKELEY SUCTION TUBING (SUCTIONS) ×2 IMPLANT
SLEEVE SCD COMPRESS KNEE MED (STOCKING) ×2 IMPLANT
TOWEL GREEN STERILE FF (TOWEL DISPOSABLE) ×2 IMPLANT
VACURETTE 10 RIGID CVD (CANNULA) IMPLANT
VACURETTE 7MM CVD STRL WRAP (CANNULA) IMPLANT
VACURETTE 8 RIGID CVD (CANNULA) IMPLANT
VACURETTE 9 RIGID CVD (CANNULA) ×2 IMPLANT

## 2020-12-21 NOTE — Discharge Instructions (Signed)

## 2020-12-21 NOTE — Anesthesia Procedure Notes (Signed)
Procedure Name: LMA Insertion Date/Time: 12/21/2020 12:12 PM Performed by: Montez Morita, Finnley Lewis W, CRNA Pre-anesthesia Checklist: Patient identified, Emergency Drugs available, Suction available and Patient being monitored Patient Re-evaluated:Patient Re-evaluated prior to induction Oxygen Delivery Method: Circle system utilized Preoxygenation: Pre-oxygenation with 100% oxygen Induction Type: IV induction Ventilation: Mask ventilation without difficulty LMA: LMA inserted LMA Size: 4.0 Number of attempts: 1 Placement Confirmation: positive ETCO2 and breath sounds checked- equal and bilateral Tube secured with: Tape Dental Injury: Teeth and Oropharynx as per pre-operative assessment

## 2020-12-21 NOTE — Op Note (Signed)
Robin Zhang PROCEDURE DATE: 12/21/2020  PREOPERATIVE DIAGNOSIS: 12 week abortion with retained POC POSTOPERATIVE DIAGNOSIS: The same PROCEDURE:     Dilation and Evacuation SURGEON:  Scheryl Darter MD  INDICATIONS: 25 y.o. [phone] with retained products of conception after 12 week miscarriage, needing surgical completion.  Risks of surgery were discussed with the patient including but not limited to: bleeding which may require transfusion; infection which may require antibiotics; injury to uterus or surrounding organs; need for additional procedures including laparotomy or laparoscopy; possibility of intrauterine scarring which may impair future fertility; and other postoperative/anesthesia complications. Written informed consent was obtained.    FINDINGS:  A 6-8 week size uterus, moderate amounts of products of conception, specimen sent to pathology.  ANESTHESIA:    Monitored intravenous sedation, paracervical block. INTRAVENOUS FLUIDS:  500 ml of LR ESTIMATED BLOOD LOSS:  100 ml. SPECIMENS:  Products of conception sent to pathology COMPLICATIONS:  None immediate.  PROCEDURE DETAILS:  The patient received intravenous Doxycycline while in the preoperative area.  She was then taken to the operating room where monitored intravenous sedation was administered and was found to be adequate.  After an adequate timeout was performed, she was placed in the dorsal lithotomy position and examined; then prepped and draped in the sterile manner. A vaginal speculum was then placed in the patient's vagina and a single tooth tenaculum was applied to the anterior lip of the cervix.  A paracervical block using 10 ml of 0.5% Marcaine was administered. The cervix was gently dilated to accommodate a 9 mm suction curette that was gently advanced to the uterine fundus.  The suction device was then activated and curette slowly rotated to clear the uterus of products of conception. There was minimal bleeding noted after  receiving Hemabate and the tenaculum removed with good hemostasis noted.   All instruments were removed from the patient's vagina.  Sponge and instrument counts were correct times two  The patient tolerated the procedure well and was taken to the recovery area awake, and in stable condition.   Adam Phenix, MD 12/21/2020 12:50 PM

## 2020-12-21 NOTE — H&P (Signed)
[facility]ne[facility] Attending History and Physical  Robin Zhang is a 25 y.o. [id number] at Unknown who presented to have D&E for presumed retained POC after miscarriage. Denies any abnormal vaginal discharge, fevers, chills, sweats, dysuria, nausea, vomiting, other GI or GU symptoms or other general symptoms.  History reviewed. No pertinent past medical history. Past Surgical History:  Procedure Laterality Date   NO PAST SURGERIES     OB History  Gravida Para Term Preterm AB Living  4 2 2   1 2   SAB IAB Ectopic Multiple Live Births  1       2    # Outcome Date GA Lbr Len/2nd Weight Sex Delivery Anes PTL Lv  4 Current           3 SAB 10/2020 [redacted]w[redacted]d         2 Term 2020    M CS-LTranv   LIV  1 Term 2016    M CS-LTranv   LIV  Patient denies any other pertinent gynecologic issues.  No current facility-administered medications on file prior to encounter.   Current Outpatient Medications on File Prior to Encounter  Medication Sig Dispense Refill   Norgestimate-Ethinyl Estradiol Triphasic 0.18/0.215/0.25 MG-25 MCG tab Take 1 tablet by mouth daily. (Patient not taking: No sig reported) 28 tablet 11   No Known Allergies  Social History:   reports that she has never smoked. She has never used smokeless tobacco. She reports that she does not currently use alcohol. She reports that she does not use drugs. History reviewed. No pertinent family history.  Review of Systems: Pertinent items noted in HPI and remainder of comprehensive ROS otherwise negative.  PHYSICAL EXAM: Blood pressure (!) 113/56, pulse 87, temperature 97.9 F (36.6 C), temperature source Oral, resp. rate 16, height 5\' 2"  (1.575 m), weight 57.4 kg, last menstrual period 07/31/2020, SpO2 100 %. CONSTITUTIONAL: Well-developed, well-nourished female in no acute distress.  HENT:  Normocephalic, atraumatic, External right and left ear normal. Oropharynx is clear and moist EYES: Conjunctivae and EOM are normal.  Pupils are equal, round, and reactive to light. No scleral icterus.  NECK: Normal range of motion, supple, no masses SKIN: Skin is warm and dry. No rash noted. Not diaphoretic. No erythema. No pallor. NEUROLOGIC: Alert and oriented to person, place, and time. Normal reflexes, muscle tone coordination. No cranial nerve deficit noted. PSYCHIATRIC: Normal mood and affect. Normal behavior. Normal judgment and thought content. CARDIOVASCULAR: Normal heart rate noted, regular rhythm RESPIRATORY: Effort and breath sounds normal, no problems with respiration noted ABDOMEN: nondistended.  Labs: Results for orders placed or performed in visit on 12/19/20 (from the past 336 hour(s))  Beta hCG quant (ref lab)   Collection Time: 12/19/20  9:41 AM  Result Value Ref Range   hCG Quant 23 mIU/mL  Results for orders placed or performed in visit on 12/11/20 (from the past 336 hour(s))  Pregnancy, urine POC   Collection Time: 12/11/20  9:52 AM  Result Value Ref Range   Preg Test, Ur NEGATIVE NEGATIVE    Imaging Studies: 12/13/20 OB Transvaginal  Result Date: 12/18/2020 CLINICAL DATA:  Dysfunctional uterine bleeding following recent miscarriage in July of 2022 EXAM: TRANSVAGINAL OB ULTRASOUND TECHNIQUE: Transvaginal ultrasound was performed for complete evaluation of the gestation as well as the maternal uterus, adnexal regions, and pelvic cul-de-sac. COMPARISON:  Ultrasound 10/24/2020, 11/01/2020 FINDINGS: Intrauterine gestational sac: Previously seen gestational sac is no longer visualized. Maternal uterus/adnexae: There is a new complex heterogeneous mass along  the left side of the uterus which appears to be arising from the endometrial stripe measuring approximately 4.4 x 4.3 x 4.4 cm. This area is markedly hypervascular with prominent vascular channels. Elongated fluid structures within the right ovary which may represent right uterine cysts or possibly reflect dilated fallopian tube adjacent to the right ovary.  No internal complexity. Left ovary was not visualized. Trace free fluid within the cul-de-sac. IMPRESSION: 1. No intrauterine pregnancy identified. 2. There is a new 4.4 cm hypervascular mass within the uterus adjacent to the endometrial stripe. Findings may represent retained products of conception or possibly a molar pregnancy. Correlation with serial beta HCG levels, short-term follow-up ultrasound, and close OB follow-up is recommended. 3. Right adnexal cysts or possibly hydrosalpinx. Attention on follow-up. These results will be called to the ordering clinician or representative by the Radiologist Assistant, and communication documented in the PACS or [employer]. Electronically Signed   By: Duanne Guess D.O.   On: 12/18/2020 12:13    Assessment: Active Problems:   Retained products of conception after delivery without hemorrhage   Plan: Patient desires surgical management with suction D&C.  The risks of surgery were discussed in detail with the patient including but not limited to: bleeding which may require transfusion or reoperation; infection which may require prolonged hospitalization or re-hospitalization and antibiotic therapy; injury to bowel, bladder, ureters and major vessels or other surrounding organs; formation of adhesions; need for additional procedures including laparotomy or subsequent procedures secondary to abnormal pathology; thromboembolic phenomenon; incisional problems and other postoperative or anesthesia complications.  Patient was told that the likelihood that her condition and symptoms will be treated effectively with this surgical management was very high; the postoperative expectations were also discussed in detail. The patient also understands the alternative treatment options which were discussed in full. All questions were answered.      Adam Phenix, MD Obstetrician & Gynecologist, [facility], [facility] 12/21/2020 11:36 AM

## 2020-12-21 NOTE — Transfer of Care (Signed)
Immediate Anesthesia Transfer of Care Note  Patient: Robin Zhang  Procedure(s) Performed: DILATATION AND EVACUATION  Patient Location: PACU  Anesthesia Type:General  Level of Consciousness: awake  Airway & Oxygen Therapy: Patient Spontanous Breathing and Patient connected to face mask oxygen  Post-op Assessment: Report given to RN and Post -op Vital signs reviewed and stable  Post vital signs: Reviewed and stable  Last Vitals:  Vitals Value Taken Time  BP 109/72 12/21/20 1251  Temp    Pulse 94 12/21/20 1254  Resp 21 12/21/20 1254  SpO2 100 % 12/21/20 1254  Vitals shown include unvalidated device data.  Last Pain:  Vitals:   12/21/20 1024  TempSrc: Oral  PainSc: 0-No pain      Patients Stated Pain Goal: 7 (12/21/20 1024)  Complications: No notable events documented.

## 2020-12-21 NOTE — Anesthesia Preprocedure Evaluation (Addendum)
Anesthesia Evaluation  Patient identified by MRN, date of birth, ID band Patient awake    Reviewed: Allergy & Precautions, NPO status , Patient's Chart, lab work & pertinent test results  History of Anesthesia Complications Negative for: history of anesthetic complications  Airway Mallampati: II  TM Distance: >3 FB Neck ROM: Full    Dental no notable dental hx.    Pulmonary neg pulmonary ROS,    Pulmonary exam normal        Cardiovascular negative cardio ROS Normal cardiovascular exam     Neuro/Psych negative neurological ROS  negative psych ROS   GI/Hepatic negative GI ROS, Neg liver ROS,   Endo/Other  negative endocrine ROS  Renal/GU negative Renal ROS  negative genitourinary   Musculoskeletal negative musculoskeletal ROS (+)   Abdominal   Peds  Hematology negative hematology ROS (+)   Anesthesia Other Findings Day of surgery medications reviewed with patient.  Reproductive/Obstetrics Retained POC                            Anesthesia Physical Anesthesia Plan  ASA: 1  Anesthesia Plan: General   Post-op Pain Management:    Induction: Intravenous  PONV Risk Score and Plan: 4 or greater and Treatment may vary due to age or medical condition, Ondansetron, Dexamethasone, Midazolam and Scopolamine patch - Pre-op  Airway Management Planned: LMA  Additional Equipment: None  Intra-op Plan:   Post-operative Plan: Extubation in OR  Informed Consent: I have reviewed the patients History and Physical, chart, labs and discussed the procedure including the risks, benefits and alternatives for the proposed anesthesia with the patient or authorized representative who has indicated his/her understanding and acceptance.     Dental advisory given and Interpreter used for interveiw  Plan Discussed with: CRNA  Anesthesia Plan Comments:        Anesthesia Quick Evaluation

## 2020-12-22 ENCOUNTER — Encounter (HOSPITAL_BASED_OUTPATIENT_CLINIC_OR_DEPARTMENT_OTHER): Payer: Self-pay | Admitting: Obstetrics & Gynecology

## 2020-12-22 NOTE — Anesthesia Postprocedure Evaluation (Signed)
Anesthesia Post Note  Patient: Robin Zhang  Procedure(s) Performed: DILATATION AND EVACUATION (Vagina )     Patient location during evaluation: PACU Anesthesia Type: General Level of consciousness: awake and alert and oriented Pain management: pain level controlled Vital Signs Assessment: post-procedure vital signs reviewed and stable Respiratory status: spontaneous breathing, nonlabored ventilation and respiratory function stable Cardiovascular status: blood pressure returned to baseline Postop Assessment: no apparent nausea or vomiting Anesthetic complications: no   No notable events documented.  Last Vitals:  Vitals:   12/21/20 1330 12/21/20 1351  BP: 100/60 122/64  Pulse: 80 81  Resp: 17 16  Temp:  36.7 C  SpO2: 94% 100%    Last Pain:  Vitals:   12/21/20 1351  TempSrc:   PainSc: 0-No pain                 Shanda Howells

## 2020-12-26 ENCOUNTER — Telehealth: Payer: Self-pay

## 2020-12-26 LAB — SURGICAL PATHOLOGY

## 2020-12-26 NOTE — Telephone Encounter (Signed)
Patient called front office and was transferred to clinical staff. Pt requests interpreter; [facility] interpreter [place] [phone]. Patient reports changing pad 5-6 times/day. Reports small clots as well. Reports changing pad for comfort, describes vaginal bleeding as "sticky." Pad is not fully saturated when changed. Reviewed bleeding precautions with patient and upcoming appt for post-op eval on Monday.

## 2020-12-27 ENCOUNTER — Ambulatory Visit: Payer: Self-pay | Admitting: Obstetrics & Gynecology

## 2021-01-01 ENCOUNTER — Ambulatory Visit: Payer: Medicaid Other | Admitting: Obstetrics & Gynecology

## 2021-01-15 ENCOUNTER — Telehealth: Payer: Self-pay

## 2021-01-15 NOTE — Telephone Encounter (Signed)
Patient called in wanting a nurse to return her call. She missed her last two appts for follow up after miscarriage is having some bleeding, doesn't know if its her cycle or what..    Please call patient and advise

## 2021-01-15 NOTE — Telephone Encounter (Signed)
Returned call by pt. Interpreter Raquel.  Spoke with pt. Pt states having vaginal bleeding with mild cramps that started on 01/12/21. Pt had miscarriage with D&E on 12/21/20. Pt had last beta on 8/30 that was 23. Pt did not have follow up beta until negative. T has follow up on 01/25/21. Pt states changing pad every hour, but not soaked, denies heavy bleeding and no clots.  Pt advised to keep monitoring symptoms and keep follow up appt on 01/25/21. Pt verbalized understanding and agreeable to plan.   Judeth Cornfield, RN

## 2021-01-15 NOTE — Telephone Encounter (Signed)
Call placed to pt with interpreter Raquel. No answer and left VM. Will try pt again later.  Judeth Cornfield, RN

## 2021-01-25 ENCOUNTER — Ambulatory Visit: Payer: Medicaid Other | Admitting: Obstetrics & Gynecology

## 2021-05-29 ENCOUNTER — Inpatient Hospital Stay (HOSPITAL_COMMUNITY)
Admission: AD | Admit: 2021-05-29 | Discharge: 2021-05-29 | Disposition: A | Discharge status: Home / Self Care | Payer: Medicaid Other | Attending: Family Medicine | Admitting: Family Medicine

## 2021-05-29 ENCOUNTER — Ambulatory Visit (INDEPENDENT_AMBULATORY_CARE_PROVIDER_SITE_OTHER): Payer: Medicaid Other | Admitting: General Practice

## 2021-05-29 ENCOUNTER — Other Ambulatory Visit: Payer: Self-pay

## 2021-05-29 DIAGNOSIS — N926 Irregular menstruation, unspecified: Secondary | ICD-10-CM

## 2021-05-29 DIAGNOSIS — Z3201 Encounter for pregnancy test, result positive: Secondary | ICD-10-CM | POA: Diagnosis not present

## 2021-05-29 DIAGNOSIS — N912 Amenorrhea, unspecified: Secondary | ICD-10-CM | POA: Diagnosis present

## 2021-05-29 LAB — POCT PREGNANCY, URINE: Preg Test, Ur: POSITIVE — AB

## 2021-05-29 MED ORDER — PREPLUS 27-1 MG PO TABS: 1.0000 | ORAL_TABLET | Freq: Every day | ORAL | 11 refills | Status: DC

## 2021-05-29 NOTE — MAU Note (Signed)
Presents requesting pregnancy test.  States has had 2 +HPT.  Denies pain or bleeding, just nauseated.

## 2021-05-29 NOTE — Progress Notes (Signed)
Patient came by office and dropped off urine sample for UPT. UPT +.  Called patient at home and informed her of + UPT. She reports first positive home test yesterday. LMP 05/01/21 EDD 02/05/22 4w today. Patient requests PNV Rx. Rx sent to pharmacy per protocol. Asked patient if she knew where she wanted to go for prenatal care and she states no. Provided [facility] office contact information as they are closest to her. Patient verbalized understanding & had no questions.   Chase Caller RN BSN 05/29/21

## 2021-05-29 NOTE — MAU Provider Note (Signed)
Ms. Robin Zhang is a 26 y.o. [phone] who present to MAU today for pregnancy confirmation. She denies abdominal pain or vaginal bleeding.   BP 109/61 (BP Location: Right Arm)    Pulse 89    Temp 98.5 F (36.9 C) (Oral)    Resp 18    Ht 5\' 2"  (1.575 m)    Wt 56.6 kg    LMP 05/01/2021 Comment: SAB   SpO2 98%    BMI 22.83 kg/m  CONSTITUTIONAL: Well-developed, well-nourished female in no acute distress.  CARDIOVASCULAR: Normal heart rate noted RESPIRATORY: Effort and breath sounds normal GASTROINTESTINAL:Soft, no distention noted.  No tenderness, rebound or guarding.  SKIN: Skin is warm and dry. No rash noted. Not diaphoretic. No erythema. No pallor. PSYCHIATRIC: Normal mood and affect. Normal behavior. Normal judgment and thought content.  MDM Medical screening exam complete Patient does not endorse any symptoms concerning for ectopic pregnancy or pregnancy related complication today.   A:  Amenorrhea  P: Discharge home Patient advised that she can present as a walk-in to [facility] for a pregnancy test M-Th between 8am-4pm or Friday between 8am -11am Reasons to return to MAU reviewed  Patient may return to MAU as needed or if her condition were to change or worsen  Saturday, NP 05/29/2021 10:35 AM

## 2021-06-07 ENCOUNTER — Telehealth: Payer: Self-pay | Admitting: Family Medicine

## 2021-06-07 NOTE — Telephone Encounter (Signed)
I returned patient's call regarding her concern for vaginal spotting. Patient states she had sexual intercourse last night and noted a slightly pink color on toilet paper when she wiped after urinating. Patient denies any pain or cramping. Patient states she has since gone to the bathroom 3 times and has not noted any additional vaginal bleeding or spotting. I assured patient that this was not concerning. I reviewed bleeding and pain precautions with patient and advised patient to go to MAU if she experiences period like bleeding or severe abdominal pain. Patient denies any other concerns or questions.   Alesia Richards, RN 06/07/21

## 2021-06-07 NOTE — Telephone Encounter (Signed)
Patient called in stating that she is [redacted] weeks pregnant she went to the bathroom twice, the first time she wiped she saw a little blood the second time she didn't. Patient is nervious due to the fact she had a miscarriage in September of 2022.

## 2021-07-19 ENCOUNTER — Ambulatory Visit (INDEPENDENT_AMBULATORY_CARE_PROVIDER_SITE_OTHER): Payer: Medicaid Other | Admitting: Obstetrics & Gynecology

## 2021-07-19 ENCOUNTER — Encounter: Payer: Medicaid Other | Admitting: Obstetrics & Gynecology

## 2021-07-19 ENCOUNTER — Encounter: Payer: Self-pay | Admitting: Obstetrics & Gynecology

## 2021-07-19 VITALS — BP 109/58 | HR 75 | Wt 124.7 lb

## 2021-07-19 DIAGNOSIS — O3680X0 Pregnancy with inconclusive fetal viability, not applicable or unspecified: Secondary | ICD-10-CM

## 2021-07-19 NOTE — Progress Notes (Signed)
Patient ID: Robin Zhang, female   DOB: 09/14/1995, 26 y.o.   MRN: [id number] ? ?Chief Complaint  ?Patient presents with  ? Initial Prenatal Visit  ? ? ?HPI ?Robin Zhang is a 26 y.o. female.  [id number] ?Patient's last menstrual period was 05/01/2021. ?Patient has been seen at pregnancy care network and [place] have not seen a developing pregnancy including [place] yesterday.  ?HPI ? ?History reviewed. No pertinent past medical history. ? ?Past Surgical History:  ?Procedure Laterality Date  ? DILATION AND EVACUATION N/A 12/21/2020  ? Procedure: DILATATION AND EVACUATION;  Surgeon: Adam Phenix, MD;  Location: [facility];  Service: Gynecology;  Laterality: N/A;  ? NO PAST SURGERIES    ? ? ?Family History  ?Problem Relation Age of Onset  ? Healthy Mother   ? Healthy Father   ? ? ?Social History ?Social History  ? ?Tobacco Use  ? Smoking status: Never  ? Smokeless tobacco: Never  ?Vaping Use  ? Vaping Use: Never used  ?Substance Use Topics  ? Alcohol use: Not Currently  ? Drug use: Never  ? ? ?No Known Allergies ? ?Current Outpatient Medications  ?Medication Sig Dispense Refill  ? Prenatal Vit-Fe Fumarate-FA (PREPLUS) 27-1 MG TABS Take 1 tablet by mouth daily. 30 tablet 11  ? ?No current facility-administered medications for this visit.  ? ? ?Review of Systems ?Review of Systems  ?Genitourinary:  Negative for pelvic pain and vaginal bleeding.  ? ?Blood pressure (!) 109/58, pulse 75, weight 124 lb 11.2 oz (56.6 kg), last menstrual period 05/01/2021. ? ?Physical Exam ?Physical Exam ?Constitutional:   ?   Appearance: Normal appearance.  ?Abdominal:  ?   General: Abdomen is flat.  ?Skin: ?   General: Skin is warm and dry.  ?Neurological:  ?   Mental Status: She is alert.  ?Psychiatric:     ?   Mood and Affect: Mood normal.     ?   Behavior: Behavior normal.  ? ?Bedside portable transabdominal US by me no IUP seen  ? ?Data Reviewed ? ? ?Assessment ?Possible early SAB ? ?Plan ?HCG and [place] f/u as indicated ? ? ? ?Scheryl Darter ?07/19/2021, 4:36 PM ? ? ? ?

## 2021-07-19 NOTE — Patient Instructions (Signed)
First Trimester of Pregnancy °The first trimester of pregnancy starts on the first day of your last menstrual period until the end of week 12. This is months 1 through 3 of pregnancy. A week after a sperm fertilizes an egg, the egg will implant into the wall of the uterus and begin to develop into a baby. By the end of 12 weeks, all the baby's organs will be formed and the baby will be 2-3 inches in size. °Body changes during your first trimester °Your body goes through many changes during pregnancy. The changes vary and generally return to normal after your baby is born. °Physical changes °You may gain or lose weight. °Your breasts may begin to grow larger and become tender. The tissue that surrounds your nipples (areola) may become darker. °Dark spots or blotches (chloasma or mask of pregnancy) may develop on your face. °You may have changes in your hair. These can include thickening or thinning of your hair or changes in texture. °Health changes °You may feel nauseous, and you may vomit. °You may have heartburn. °You may develop headaches. °You may develop constipation. °Your gums may bleed and may be sensitive to brushing and flossing. °Other changes °You may tire easily. °You may urinate more often. °Your menstrual periods will stop. °You may have a loss of appetite. °You may develop cravings for certain kinds of food. °You may have changes in your emotions from day to day. °You may have more vivid and strange dreams. °Follow these instructions at home: °Medicines °Follow your health care provider's instructions regarding medicine use. Specific medicines may be either safe or unsafe to take during pregnancy. Do not take any medicines unless told to by your health care provider. °Take a prenatal vitamin that contains at least 600 micrograms (mcg) of folic acid. °Eating and drinking °Eat a healthy diet that includes fresh fruits and vegetables, whole grains, good sources of protein such as meat, eggs, or tofu,  and low-fat dairy products. °Avoid raw meat and unpasteurized juice, milk, and cheese. These carry germs that can harm you and your baby. °If you feel nauseous or you vomit: °Eat 4 or 5 small meals a day instead of 3 large meals. °Try eating a few soda crackers. °Drink liquids between meals instead of during meals. °You may need to take these actions to prevent or treat constipation: °Drink enough fluid to keep your urine pale yellow. °Eat foods that are high in fiber, such as beans, whole grains, and fresh fruits and vegetables. °Limit foods that are high in fat and processed sugars, such as fried or sweet foods. °Activity °Exercise only as directed by your health care provider. Most people can continue their usual exercise routine during pregnancy. Try to exercise for 30 minutes at least 5 days a week. °Stop exercising if you develop pain or cramping in the lower abdomen or lower back. °Avoid exercising if it is very hot or humid or if you are at high altitude. °Avoid heavy lifting. °If you choose to, you may have sex unless your health care provider tells you not to. °Relieving pain and discomfort °Wear a good support bra to relieve breast tenderness. °Rest with your legs elevated if you have leg cramps or low back pain. °If you develop bulging veins (varicose veins) in your legs: °Wear support hose as told by your health care provider. °Elevate your feet for 15 minutes, 3-4 times a day. °Limit salt in your diet. °Safety °Wear your seat belt at all times when driving   or riding in a car. °Talk with your health care provider if someone is verbally or physically abusive to you. °Talk with your health care provider if you are feeling sad or have thoughts of hurting yourself. °Lifestyle °Do not use hot tubs, steam rooms, or saunas. °Do not douche. Do not use tampons or scented sanitary pads. °Do not use herbal remedies, alcohol, illegal drugs, or medicines that are not approved by your health care provider. Chemicals  in these products can harm your baby. °Do not use any products that contain nicotine or tobacco, such as cigarettes, e-cigarettes, and chewing tobacco. If you need help quitting, ask your health care provider. °Avoid cat litter boxes and soil used by cats. These carry germs that can cause birth defects in the baby and possibly loss of the unborn baby (fetus) by miscarriage or stillbirth. °General instructions °During routine prenatal visits in the first trimester, your health care provider will do a physical exam, perform necessary tests, and ask you how things are going. Keep all follow-up visits. This is important. °Ask for help if you have counseling or nutritional needs during pregnancy. Your health care provider can offer advice or refer you to specialists for help with various needs. °Schedule a dentist appointment. At home, brush your teeth with a soft toothbrush. Floss gently. °Write down your questions. Take them to your prenatal visits. °Where to find more information °American Pregnancy Association: americanpregnancy.org °American College of Obstetricians and Gynecologists: acog.org/en/Womens%20Health/Pregnancy °Office on Women's Health: womenshealth.gov/pregnancy °Contact a health care provider if you have: °Dizziness. °A fever. °Mild pelvic cramps, pelvic pressure, or nagging pain in the abdominal area. °Nausea, vomiting, or diarrhea that lasts for 24 hours or longer. °A bad-smelling vaginal discharge. °Pain when you urinate. °Known exposure to a contagious illness, such as chickenpox, measles, Zika virus, HIV, or hepatitis. °Get help right away if you have: °Spotting or bleeding from your vagina. °Severe abdominal cramping or pain. °Shortness of breath or chest pain. °Any kind of trauma, such as from a fall or a car crash. °New or increased pain, swelling, or redness in an arm or leg. °Summary °The first trimester of pregnancy starts on the first day of your last menstrual period until the end of week  12 (months 1 through 3). °Eating 4 or 5 small meals a day rather than 3 large meals may help to relieve nausea and vomiting. °Do not use any products that contain nicotine or tobacco, such as cigarettes, e-cigarettes, and chewing tobacco. If you need help quitting, ask your health care provider. °Keep all follow-up visits. This is important. °This information is not intended to replace advice given to you by your health care provider. Make sure you discuss any questions you have with your health care provider. °Document Revised: 09/15/2019 Document Reviewed: 07/22/2019 °Elsevier Patient Education © 2022 [employer] ° °

## 2021-07-20 LAB — BETA HCG QUANT (REF LAB): hCG Quant: 22956 m[IU]/mL

## 2021-07-23 ENCOUNTER — Telehealth: Payer: Self-pay | Admitting: Family Medicine

## 2021-07-23 DIAGNOSIS — O3680X0 Pregnancy with inconclusive fetal viability, not applicable or unspecified: Secondary | ICD-10-CM

## 2021-07-23 NOTE — Telephone Encounter (Signed)
Spanish.Marland KitchenMarland KitchenMarland KitchenPatient would like a call back about her test results from her most recent appointment. ?

## 2021-07-23 NOTE — Telephone Encounter (Signed)
Called pt w/interpreter Lorinda Creed. She was informed of HCG results and recommendation from Dr. Debroah Loop for Ob US. Pt stated that she is not having any abdominal pain or bleeding and agreed to [place] appt on 4/11 @ 0900. She was instructed to go to [facility] if she develops heavy vaginal bleeding or abdominal pain. She voiced understanding.   ?

## 2021-07-31 ENCOUNTER — Telehealth: Payer: Self-pay | Admitting: Family Medicine

## 2021-07-31 ENCOUNTER — Other Ambulatory Visit: Payer: Self-pay | Admitting: Obstetrics & Gynecology

## 2021-07-31 ENCOUNTER — Ambulatory Visit (INDEPENDENT_AMBULATORY_CARE_PROVIDER_SITE_OTHER): Payer: Medicaid Other

## 2021-07-31 ENCOUNTER — Encounter: Payer: Self-pay | Admitting: General Practice

## 2021-07-31 DIAGNOSIS — O3680X0 Pregnancy with inconclusive fetal viability, not applicable or unspecified: Secondary | ICD-10-CM

## 2021-07-31 NOTE — Progress Notes (Unsigned)
Patient presents to office this morning for viability/dating ultrasound. Reviewed ultrasound images with Dr Crissie Reese who finds ultrasound consistent with non viable pregnancy. Also reviewed images with Dr Debroah Loop. Discussed results with patient. Apologized to patient that there was not a provider available at this time to review results with her and plan of care but someone would call her at home later this afternoon. Patient verbalized understanding. ? ?Chase Caller RN BSN ?07/31/21 ?

## 2021-07-31 NOTE — Telephone Encounter (Signed)
Attempted to call patient x3 to discuss [place] results showing non-viable IUP with recommendation for medical management. ? ?No answer, left voicemail stating I was a doctor from the clinic and that I was trying to reach her about results. I will try to call her again tomorrow. ?

## 2021-07-31 NOTE — Progress Notes (Deleted)
Patient presents to office this morning for viability/dating ultrasound. Reviewed ultrasound images with Dr Eckstat who finds ultrasound consistent with non viable pregnancy. Also reviewed images with Dr Arnold. Discussed results with patient. Apologized to patient that there was not a provider available at this time to review results with her and plan of care but someone would call her at home later this afternoon. Patient verbalized understanding. ? ?Doloris Servantes H RN BSN ?07/31/21 ?

## 2021-08-02 ENCOUNTER — Telehealth: Payer: Self-pay | Admitting: Family Medicine

## 2021-08-02 NOTE — Telephone Encounter (Signed)
Patient called and stated that she was having trouble with her phone and she is aware that Dr. Crissie Reese but she couldn't receive calls at the time... her phone is working now and she would like someone to give her a call back regarding her [place] results and her next steps. ?

## 2021-08-03 ENCOUNTER — Telehealth: Payer: Self-pay | Admitting: *Deleted

## 2021-08-03 ENCOUNTER — Encounter (HOSPITAL_COMMUNITY): Payer: Self-pay | Admitting: Obstetrics and Gynecology

## 2021-08-03 ENCOUNTER — Other Ambulatory Visit: Payer: Self-pay

## 2021-08-03 NOTE — Telephone Encounter (Signed)
Patient called again today and Dr. Crissie Reese talked with her, please see telephone note from today ?Robin Zhang ?

## 2021-08-03 NOTE — Telephone Encounter (Signed)
Patient called front desk reporting vaginal bleeding and wanting to speak to nurse about bleeding and [place] results. Per chart Dr. Dione Plover tried to reach her yesterday. He will speak with her today. I called and transferred call to him. ?Staci Acosta ?

## 2021-08-03 NOTE — Telephone Encounter (Signed)
Patient called clinic and I was able to speak with her. ? ?Confirmed identity with two markers.  ? ?Patient already aware that pregnancy was not normal, confirmed she had a non-viable pregnancy on [place]. She reports she had a similar situation last year and had failed medical management, strongly prefers to proceed directly to surgical management. Message sent to schedule procedure. ? ?Also interested in birth control, had IUD in the past and it worked well for her, will see if that can be placed at time of D&C as well.  ? ?Clarnce Flock, MD/MPH ?Attending Family Medicine Physician, Faculty Practice ?[facility] ? ?10:02 AM ?08/03/21 ? ?

## 2021-08-03 NOTE — Progress Notes (Signed)
TWO VISITORS ARE ALLOWED TO COME WITH YOU AND STAY IN THE SURGICAL WAITING ROOM ONLY DURING PRE OP AND PROCEDURE DAY OF SURGERY.  ? ?PCP - none ?Cardiologist - n/a ? ?Chest x-ray - n/a ?EKG - n/a ?Stress Test - n/a ?ECHO - n/a ?Cardiac Cath - n/a ? ?ICD Pacemaker/Loop - n/a ? ?Sleep Study -  n/a ?CPAP - none ? ?STOP now taking any Aspirin (unless otherwise instructed by your surgeon), Aleve, Naproxen, Ibuprofen, Motrin, Advil, Goody's, BC's, all herbal medications, fish oil, and all vitamins.  ? ?Coronavirus Screening ?Do you have any of the following symptoms:  ?Cough yes/no: No ?Fever (>100.47F)  yes/no: No ?Runny nose yes/no: No ?Sore throat yes/no: No ?Difficulty breathing/shortness of breath  yes/no: No ? ?Have you traveled in the last 14 days and where? yes/no: No ? ?Patient verbalized understanding of instructions that were given via phone. ?

## 2021-08-05 ENCOUNTER — Other Ambulatory Visit: Payer: Self-pay | Admitting: Obstetrics and Gynecology

## 2021-08-05 DIAGNOSIS — Z30430 Encounter for insertion of intrauterine contraceptive device: Secondary | ICD-10-CM

## 2021-08-06 MED ORDER — DOXYCYCLINE HYCLATE 100 MG IV SOLR
200.0000 mg | Freq: Once | INTRAVENOUS | Status: AC
  Administered 2021-08-07: 200 mg via INTRAVENOUS
  Filled 2021-08-06 (×2): qty 200

## 2021-08-06 MED ORDER — LEVONORGESTREL 20.1 MCG/DAY IU IUD
1.0000 | INTRAUTERINE_SYSTEM | Freq: Once | INTRAUTERINE | Status: AC
  Administered 2021-08-07: 1 via INTRAUTERINE
  Filled 2021-08-06 (×3): qty 1

## 2021-08-07 ENCOUNTER — Ambulatory Visit (HOSPITAL_BASED_OUTPATIENT_CLINIC_OR_DEPARTMENT_OTHER): Payer: Medicaid Other | Admitting: Anesthesiology

## 2021-08-07 ENCOUNTER — Other Ambulatory Visit (HOSPITAL_COMMUNITY)
Admission: RE | Admit: 2021-08-07 | Payer: Medicaid Other | Source: Ambulatory Visit | Admitting: Obstetrics and Gynecology

## 2021-08-07 ENCOUNTER — Encounter (HOSPITAL_COMMUNITY)
Admission: RE | Disposition: A | Discharge status: Home / Self Care | Payer: Self-pay | Source: Home / Self Care | Attending: Obstetrics and Gynecology

## 2021-08-07 ENCOUNTER — Other Ambulatory Visit: Payer: Self-pay

## 2021-08-07 ENCOUNTER — Ambulatory Visit (HOSPITAL_COMMUNITY)
Admission: RE | Admit: 2021-08-07 | Discharge: 2021-08-07 | Disposition: A | Discharge status: Home / Self Care | Payer: Medicaid Other | Attending: Obstetrics and Gynecology | Admitting: Obstetrics and Gynecology

## 2021-08-07 ENCOUNTER — Encounter (HOSPITAL_COMMUNITY): Payer: Self-pay | Admitting: Obstetrics and Gynecology

## 2021-08-07 ENCOUNTER — Ambulatory Visit (HOSPITAL_COMMUNITY): Payer: Medicaid Other | Admitting: Anesthesiology

## 2021-08-07 DIAGNOSIS — O021 Missed abortion: Secondary | ICD-10-CM

## 2021-08-07 DIAGNOSIS — Z3A01 Less than 8 weeks gestation of pregnancy: Secondary | ICD-10-CM | POA: Diagnosis not present

## 2021-08-07 DIAGNOSIS — Z30430 Encounter for insertion of intrauterine contraceptive device: Secondary | ICD-10-CM

## 2021-08-07 DIAGNOSIS — Z9889 Other specified postprocedural states: Secondary | ICD-10-CM

## 2021-08-07 DIAGNOSIS — Z124 Encounter for screening for malignant neoplasm of cervix: Secondary | ICD-10-CM

## 2021-08-07 HISTORY — PX: INTRAUTERINE DEVICE (IUD) INSERTION: SHX5877

## 2021-08-07 HISTORY — PX: DILATION AND EVACUATION: SHX1459

## 2021-08-07 LAB — CBC
HCT: 42.3 % (ref 36.0–46.0)
Hemoglobin: 13.9 g/dL (ref 12.0–15.0)
MCH: 29.7 pg (ref 26.0–34.0)
MCHC: 32.9 g/dL (ref 30.0–36.0)
MCV: 90.4 fL (ref 80.0–100.0)
Platelets: 293 10*3/uL (ref 150–400)
RBC: 4.68 MIL/uL (ref 3.87–5.11)
RDW: 14.5 % (ref 11.5–15.5)
WBC: 9.3 10*3/uL (ref 4.0–10.5)
nRBC: 0 % (ref 0.0–0.2)

## 2021-08-07 LAB — TYPE AND SCREEN
ABO/RH(D): O POS
Antibody Screen: NEGATIVE

## 2021-08-07 SURGERY — DILATION AND EVACUATION, UTERUS
Anesthesia: General | Site: Vagina

## 2021-08-07 MED ORDER — DEXAMETHASONE SODIUM PHOSPHATE 10 MG/ML IJ SOLN
INTRAMUSCULAR | Status: DC | PRN
  Administered 2021-08-07: 10 mg via INTRAVENOUS

## 2021-08-07 MED ORDER — SCOPOLAMINE 1 MG/3DAYS TD PT72: 1.0000 | MEDICATED_PATCH | TRANSDERMAL | Status: DC

## 2021-08-07 MED ORDER — MIDAZOLAM HCL 2 MG/2ML IJ SOLN
INTRAMUSCULAR | Status: AC
  Filled 2021-08-07: qty 2

## 2021-08-07 MED ORDER — PROPOFOL 10 MG/ML IV BOLUS
INTRAVENOUS | Status: AC
  Filled 2021-08-07: qty 20

## 2021-08-07 MED ORDER — LIDOCAINE HCL 1 % IJ SOLN
INTRAMUSCULAR | Status: DC | PRN
  Administered 2021-08-07: 20 mL

## 2021-08-07 MED ORDER — PROPOFOL 10 MG/ML IV BOLUS
INTRAVENOUS | Status: DC | PRN
  Administered 2021-08-07: 50 mg via INTRAVENOUS
  Administered 2021-08-07: 150 mg via INTRAVENOUS

## 2021-08-07 MED ORDER — MEPERIDINE HCL 25 MG/ML IJ SOLN: 6.2500 mg | INTRAMUSCULAR | Status: DC | PRN

## 2021-08-07 MED ORDER — AMISULPRIDE (ANTIEMETIC) 5 MG/2ML IV SOLN: 10.0000 mg | Freq: Once | INTRAVENOUS | Status: DC | PRN

## 2021-08-07 MED ORDER — POVIDONE-IODINE 10 % EX SWAB
2.0000 "application " | Freq: Once | CUTANEOUS | Status: AC
  Administered 2021-08-07: 2 via TOPICAL

## 2021-08-07 MED ORDER — CHLORHEXIDINE GLUCONATE 0.12 % MT SOLN: 15.0000 mL | Freq: Once | OROMUCOSAL | Status: AC

## 2021-08-07 MED ORDER — ACETAMINOPHEN 10 MG/ML IV SOLN
INTRAVENOUS | Status: AC
  Filled 2021-08-07: qty 100

## 2021-08-07 MED ORDER — IBUPROFEN 600 MG PO TABS: 600.0000 mg | ORAL_TABLET | Freq: Four times a day (QID) | ORAL | 0 refills | Status: DC | PRN

## 2021-08-07 MED ORDER — FENTANYL CITRATE (PF) 250 MCG/5ML IJ SOLN
INTRAMUSCULAR | Status: AC
  Filled 2021-08-07: qty 5

## 2021-08-07 MED ORDER — DOCUSATE SODIUM 100 MG PO CAPS: 100.0000 mg | ORAL_CAPSULE | Freq: Two times a day (BID) | ORAL | 0 refills | Status: DC | PRN

## 2021-08-07 MED ORDER — OXYCODONE-ACETAMINOPHEN 5-325 MG PO TABS: 1.0000 | ORAL_TABLET | Freq: Four times a day (QID) | ORAL | 0 refills | Status: DC | PRN

## 2021-08-07 MED ORDER — ORAL CARE MOUTH RINSE: 15.0000 mL | Freq: Once | OROMUCOSAL | Status: AC

## 2021-08-07 MED ORDER — ACETAMINOPHEN 10 MG/ML IV SOLN
INTRAVENOUS | Status: DC | PRN
  Administered 2021-08-07: 1000 mg via INTRAVENOUS

## 2021-08-07 MED ORDER — SODIUM CHLORIDE 0.9 % IV SOLN: INTRAVENOUS | Status: DC

## 2021-08-07 MED ORDER — LIDOCAINE 2% (20 MG/ML) 5 ML SYRINGE
INTRAMUSCULAR | Status: DC | PRN
  Administered 2021-08-07: 60 mg via INTRAVENOUS

## 2021-08-07 MED ORDER — MIDAZOLAM HCL 2 MG/2ML IJ SOLN
INTRAMUSCULAR | Status: DC | PRN
  Administered 2021-08-07: 2 mg via INTRAVENOUS

## 2021-08-07 MED ORDER — ONDANSETRON HCL 4 MG/2ML IJ SOLN
INTRAMUSCULAR | Status: DC | PRN
Start: 2021-08-07 — End: 2021-08-07
  Administered 2021-08-07: 4 mg via INTRAVENOUS

## 2021-08-07 MED ORDER — LIDOCAINE HCL (PF) 1 % IJ SOLN
INTRAMUSCULAR | Status: AC
  Filled 2021-08-07: qty 30

## 2021-08-07 MED ORDER — ACETAMINOPHEN 500 MG PO TABS: 1000.0000 mg | ORAL_TABLET | Freq: Once | ORAL | Status: DC

## 2021-08-07 MED ORDER — CHLORHEXIDINE GLUCONATE 0.12 % MT SOLN
OROMUCOSAL | Status: AC
  Administered 2021-08-07: 15 mL via OROMUCOSAL
  Filled 2021-08-07: qty 15

## 2021-08-07 MED ORDER — FENTANYL CITRATE (PF) 250 MCG/5ML IJ SOLN
INTRAMUSCULAR | Status: DC | PRN
  Administered 2021-08-07 (×2): 25 ug via INTRAVENOUS

## 2021-08-07 MED ORDER — LACTATED RINGERS IV SOLN: INTRAVENOUS | Status: DC

## 2021-08-07 MED ORDER — KETOROLAC TROMETHAMINE 15 MG/ML IJ SOLN
INTRAMUSCULAR | Status: DC | PRN
  Administered 2021-08-07: 15 mg via INTRAVENOUS

## 2021-08-07 SURGICAL SUPPLY — 29 items
CATH ROBINSON RED A/P 16FR (CATHETERS) ×2 IMPLANT
DECANTER SPIKE VIAL GLASS SM (MISCELLANEOUS) ×2 IMPLANT
FILTER UTR ASPR ASSEMBLY (MISCELLANEOUS) ×2 IMPLANT
GLOVE BIOGEL PI IND STRL 7.0 (GLOVE) ×1 IMPLANT
GLOVE BIOGEL PI IND STRL 7.5 (GLOVE) ×1 IMPLANT
GLOVE BIOGEL PI INDICATOR 7.0 (GLOVE) ×1
GLOVE BIOGEL PI INDICATOR 7.5 (GLOVE) ×1
GLOVE NEODERM STER SZ 7 (GLOVE) ×2 IMPLANT
GLOVE SURG SS PI 7.0 STRL IVOR (GLOVE) ×2 IMPLANT
GOWN STRL REUS W/ TWL LRG LVL3 (GOWN DISPOSABLE) ×1 IMPLANT
GOWN STRL REUS W/ TWL XL LVL3 (GOWN DISPOSABLE) ×1 IMPLANT
GOWN STRL REUS W/TWL LRG LVL3 (GOWN DISPOSABLE) ×1
GOWN STRL REUS W/TWL XL LVL3 (GOWN DISPOSABLE) ×1
HIBICLENS CHG 4% 4OZ BTL (MISCELLANEOUS) ×2 IMPLANT
HOSE CONNECTING 18IN BERKELEY (TUBING) ×2 IMPLANT
KIT BERKELEY 1ST TRI 3/8 NO TR (MISCELLANEOUS) ×2 IMPLANT
KIT BERKELEY 1ST TRIMESTER 3/8 (MISCELLANEOUS) ×2 IMPLANT
NS IRRIG 1000ML POUR BTL (IV SOLUTION) ×2 IMPLANT
PACK VAGINAL MINOR WOMEN LF (CUSTOM PROCEDURE TRAY) ×2 IMPLANT
PAD OB MATERNITY 4.3X12.25 (PERSONAL CARE ITEMS) ×2 IMPLANT
PAD PREP 24X48 CUFFED NSTRL (MISCELLANEOUS) ×2 IMPLANT
SET BERKELEY SUCTION TUBING (SUCTIONS) ×2 IMPLANT
TOWEL GREEN STERILE FF (TOWEL DISPOSABLE) ×4 IMPLANT
VACURETTE 10 RIGID CVD (CANNULA) ×1 IMPLANT
VACURETTE 7MM CVD STRL WRAP (CANNULA) IMPLANT
VACURETTE 8 RIGID CVD (CANNULA) IMPLANT
VACURETTE 8MM F TIP (MISCELLANEOUS) ×1 IMPLANT
VACURETTE 9 RIGID CVD (CANNULA) IMPLANT
liletta ×1 IMPLANT

## 2021-08-07 NOTE — Anesthesia Procedure Notes (Signed)
Procedure Name: LMA Insertion ?Date/Time: 08/07/2021 9:09 AM ?Performed by: Cy Blamer, CRNA ?Pre-anesthesia Checklist: Patient identified, Emergency Drugs available, Suction available and Patient being monitored ?Patient Re-evaluated:Patient Re-evaluated prior to induction ?Oxygen Delivery Method: Circle system utilized ?Preoxygenation: Pre-oxygenation with 100% oxygen ?Induction Type: IV induction ?Ventilation: Mask ventilation without difficulty ?LMA: LMA inserted ?LMA Size: 4.0 ?Number of attempts: 1 ?Placement Confirmation: positive ETCO2 and breath sounds checked- equal and bilateral ?Tube secured with: Tape ?Dental Injury: Teeth and Oropharynx as per pre-operative assessment  ? ? ? ? ?

## 2021-08-07 NOTE — Transfer of Care (Signed)
Immediate Anesthesia Transfer of Care Note ? ?Patient: Robin Zhang ? ?Procedure(s) Performed: DILATATION AND EVACUATION (Vagina ) ?INTRAUTERINE DEVICE (IUD) INSERTION (Vagina ) ?CHROMOSOME STUDIES (Vagina ) ? ?Patient Location: PACU ? ?Anesthesia Type:General ? ?Level of Consciousness: awake, alert  and oriented ? ?Airway & Oxygen Therapy: Patient Spontanous Breathing and Patient connected to nasal cannula oxygen ? ?Post-op Assessment: Report given to RN, Post -op Vital signs reviewed and stable, Patient moving all extremities X 4 and Patient able to stick tongue midline ? ?Post vital signs: Reviewed ? ?Last Vitals:  ?Vitals Value Taken Time  ?BP 117/63   ?Temp 97.9   ?Pulse 112 08/07/21 0958  ?Resp 17 08/07/21 0958  ?SpO2 100 % 08/07/21 0958  ?Vitals shown include unvalidated device data. ? ?Last Pain:  ?Vitals:  ? 08/07/21 0743  ?TempSrc:   ?PainSc: 0-No pain  ?   ? ?  ? ?Complications: No notable events documented. ?

## 2021-08-07 NOTE — Brief Op Note (Signed)
08/07/2021 ? ?9:47 AM ? ?PATIENT:  Robin Zhang  26 y.o. female ? ?PRE-OPERATIVE DIAGNOSIS:  Missed AB. Desire for [name] IUD ?Encounter for contraception ? ?POST-OPERATIVE DIAGNOSIS:  Missed AB ?Encounter for contraception ? ?PROCEDURE:  Procedure(s): ?DILATATION AND EVACUATION (N/A) ?INTRAUTERINE DEVICE (IUD) INSERTION (N/A) ?CHROMOSOME STUDIES (N/A) ? ?SURGEON:  Surgeon(s) and Role: ?   * [place] Bing, MD - Primary ? ?PHYSICIAN ASSISTANT: none ? ?ASSISTANTS: none  ? ?ANESTHESIA:   general and paracervical block ? ?EBL:  500 mL  ? ?BLOOD ADMINISTERED:none ? ?DRAINS:  I/O cath 73mL   ? ?LOCAL MEDICATIONS USED:  LIDOCAINE  ? ?SPECIMEN:  pap smear and products of conception. [name] testing sent ? ?DISPOSITION OF SPECIMEN:  PATHOLOGY and [place] ? ?COUNTS:  YES ? ?TOURNIQUET:  * No tourniquets in log * ? ?DICTATION: .Note written in EPIC ? ?PLAN OF CARE: Discharge to home after PACU ? ?PATIENT DISPOSITION:  PACU - hemodynamically stable. ?  ?Delay start of Pharmacological VTE agent (>24hrs) due to surgical blood loss or risk of bleeding: not applicable ? ?Cornelia Copa MD ?Attending ?[facility]) ?08/07/2021 ?Time: [phone] ? ? ?

## 2021-08-07 NOTE — Anesthesia Preprocedure Evaluation (Signed)
Anesthesia Evaluation  ?Patient identified by MRN, date of birth, ID band ?Patient awake ? ? ? ?Reviewed: ?Allergy & Precautions, NPO status , Patient's Chart, lab work & pertinent test results ? ?Airway ?Mallampati: I ? ?TM Distance: >3 FB ?Neck ROM: Full ? ? ? Dental ?no notable dental hx. ?(+) Dental Advisory Given, Teeth Intact ?  ?Pulmonary ?neg pulmonary ROS,  ?  ?Pulmonary exam normal ?breath sounds clear to auscultation ? ? ? ? ? ? Cardiovascular ?negative cardio ROS ?Normal cardiovascular exam ?Rhythm:Regular Rate:Normal ? ? ?  ?Neuro/Psych ?negative neurological ROS ?   ? GI/Hepatic ?negative GI ROS, Neg liver ROS,   ?Endo/Other  ?negative endocrine ROS ? Renal/GU ?negative Renal ROS  ? ?  ?Musculoskeletal ?negative musculoskeletal ROS ?(+)  ? Abdominal ?  ?Peds ? Hematology ?negative hematology ROS ?(+)   ?Anesthesia Other Findings ? ? Reproductive/Obstetrics ? ?  ? ? ? ? ? ? ? ? ? ? ? ? ? ?  ?  ? ? ? ? ? ? ? ?Anesthesia Physical ?Anesthesia Plan ? ?ASA: 2 ? ?Anesthesia Plan: General  ? ?Post-op Pain Management: Toradol IV (intra-op)* and Ofirmev IV (intra-op)*  ? ?Induction: Intravenous ? ?PONV Risk Score and Plan: 4 or greater and Ondansetron, Dexamethasone, Midazolam, Treatment may vary due to age or medical condition and Scopolamine patch - Pre-op ? ?Airway Management Planned: LMA ? ?Additional Equipment:  ? ?Intra-op Plan:  ? ?Post-operative Plan: Extubation in OR ? ?Informed Consent: I have reviewed the patients History and Physical, chart, labs and discussed the procedure including the risks, benefits and alternatives for the proposed anesthesia with the patient or authorized representative who has indicated his/her understanding and acceptance.  ? ? ? ?Dental advisory given ? ?Plan Discussed with: CRNA ? ?Anesthesia Plan Comments:   ? ? ? ? ? ? ?Anesthesia Quick Evaluation ? ?

## 2021-08-07 NOTE — H&P (Signed)
Obstetrics & Gynecology Surgical H&P  ? ?Date of Surgery: 08/07/2021  ? ? ?[facility] ? ?Reason for Admission: scheduled d&c, Liletta IUD placement ? ?History of Present Illness: Ms. Wickizer is a 26 y.o. [phone] (Patient's last menstrual period was 05/01/2021.), with the above CC. PMHx is significant for nothing ? ?Patient had early [facility] that showed a yolk sac and then she had two more u/s that showed no progression; I reviewed the images.  She was then referred to [place] and had a formal u/s on 4/11 that showed a fetal pole at 78mm but no cardiac motion; I reviewed the images ? ?Patient only with spotting, no pain since u/s last week   ? ?ROS: A 12-point review of systems was performed and negative, except as stated in the above HPI. ? ?OBGYN History: As per HPI. ?OB History  ?Gravida Para Term Preterm AB Living  ?5 2 2   2 2   ?SAB IAB Ectopic Multiple Live Births  ?2       2  ?  ?# Outcome Date GA Lbr Len/2nd Weight Sex Delivery Anes PTL Lv  ?5 Current           ?4 SAB 12/21/20          ?3 SAB 10/2020 [redacted]w[redacted]d         ?2 Term 2020    M CS-LTranv   LIV  ?1 Term 2016    M CS-LTranv   LIV  ? ?History of pap smears: patient unsure. ?  ?Past Medical History: ?History reviewed. No pertinent past medical history. ? ?Past Surgical History: ?Past Surgical History:  ?Procedure Laterality Date  ? DILATION AND EVACUATION N/A 12/21/2020  ? Procedure: DILATATION AND EVACUATION;  Surgeon: 02/20/2021, MD;  Location: [facility];  Service: Gynecology;  Laterality: N/A;  ? ? ?Family History:  ?Family History  ?Problem Relation Age of Onset  ? Healthy Mother   ? Healthy Father   ? ? ?Social History:  ?Social History  ? ?Socioeconomic History  ? Marital status: Single  ?  Spouse name: Not on file  ? Number of children: Not on file  ? Years of education: Not on file  ? Highest education level: Not on file  ?Occupational History  ? Not on file   ?Tobacco Use  ? Smoking status: Never  ? Smokeless tobacco: Never  ?Vaping Use  ? Vaping Use: Never used  ?Substance and Sexual Activity  ? Alcohol use: Not Currently  ? Drug use: Never  ? Sexual activity: Yes  ?  Birth control/protection: None  ?  Comment: approx [redacted] wks gestation  ?Other Topics Concern  ? Not on file  ?Social History Narrative  ? Not on file  ? ?Social Determinants of Health  ? ?Financial Resource Strain: Not on file  ?Food Insecurity: No Food Insecurity  ? Worried About Adam Phenix in the Last Year: Never true  ? Ran Out of Food in the Last Year: Never true  ?Transportation Needs: No Transportation Needs  ? Lack of Transportation (Medical): No  ? Lack of Transportation (Non-Medical): No  ?Physical Activity: Not on file  ?Stress: Not on file  ?Social Connections: Not on file  ?Intimate Partner Violence: Not on file  ? ? ?Allergy: ?No Known Allergies ? ?Current Outpatient Medications: ?Medications Prior to Admission  ?Medication Sig Dispense Refill Last Dose  ? Prenatal Vit-Fe Fumarate-FA (PREPLUS) 27-1  MG TABS Take 1 tablet by mouth daily. (Patient not taking: Reported on 08/03/2021) 30 tablet 11 Not Taking  ? ? ? ?Hospital Medications: ?Current Facility-Administered Medications  ?Medication Dose Route Frequency Provider Last Rate Last Admin  ? 0.9 %  sodium chloride infusion   Intravenous Continuous Vernon Bing, MD      ? doxycycline (VIBRAMYCIN) 200 mg in dextrose 5 % 250 mL IVPB  200 mg Intravenous Once Scarlett Presto, [facility]      ? lactated ringers infusion   Intravenous Continuous Lewie Loron, MD 10 mL/hr at 08/07/21 0801 Continued from Pre-op at 08/07/21 0801  ? levonorgestrel (LILETTA) 20.1 MCG/DAY IUD 1 each  1 each Intrauterine Once Scarlett Presto, RPH      ? ? ? ?Physical Exam: ? Current Vital Signs 24h Vital Sign Ranges  ?T 98 ?F (36.7 ?C) Temp  Avg: 98 ?F (36.7 ?C)  Min: 98 ?F (36.7 ?C)  Max: 98 ?F (36.7 ?C)  ?BP 109/73 BP  Min: 109/73  Max: 109/73  ?HR 96 Pulse  Avg: 96   Min: 96  Max: 96  ?RR 17 Resp  Avg: 17  Min: 17  Max: 17  ?SaO2 100 % Room Air SpO2  Avg: 100 %  Min: 100 %  Max: 100 %  ?    ? 24 Hour I/O Current Shift I/O  ?Time ?Ins ?Outs No intake/output data recorded. No intake/output data recorded.  ? ? ?Body mass index is 22.68 kg/m?. ?General appearance: Well nourished, well developed female in no acute distress.  ?Cardiovascular: S1, S2 normal, no murmur, rub or gallop, regular rate and rhythm ?Respiratory:  Clear to auscultation bilateral. Normal respiratory effort ?Abdomen: positive bowel sounds and no masses, hernias; diffusely non tender to palpation, non distended ?Neuro/Psych:  Normal mood and affect.  ?Skin:  Warm and dry.  ?Extremities: no clubbing, cyanosis, or edema.  ? ?Laboratory: ?O pos ? ?Recent Labs  ?Lab 08/07/21 ?0751  ?WBC 9.3  ?HGB 13.9  ?HCT 42.3  ?PLT 293  ? ?No results for input(s): NA, K, CL, CO2, BUN, CREATININE, CALCIUM, PROT, BILITOT, ALKPHOS, ALT, AST, GLUCOSE in the last 168 hours. ? ?Invalid input(s): LABALBU ?No results for input(s): APTT, INR, PTT in the last 168 hours. ? ?Invalid input(s): DRHAPTT ?Recent Labs  ?Lab 08/07/21 ?0744  ?ABORH PENDING  ? ? ?Imaging:  ?See above. Irregular GS and YS seen with CRL of 3-16mm and no cardiac motion on u/s images reviewed by me today ? ?Assessment: Ms. Ricchio is a 26 y.o. [phone] (Patient's last menstrual period was 05/01/2021.) here for scheduled surgery ? ?Plan: ?I told her that I [facility] and from last week and I agree with the diagnosis of a missed abortion.  She would like to proceed with surgery and she states she would like a hormonal IUD given she has somewhat painful periods.  I don't see a pap smear in the system and she's unsure if she's had one.  ? ?She has two kids from a different FOB but she's had two SABs in a row (this is #2) with her current partner with last one in September 2022. I told them that [name] testing would be worthwhile and may provide  some useful information. He has children from a prior relationship. They would like to do [name].  ? ?Patient consented for suction d&c, Liletta IUD placement and pap smear ? ?Patient and husband declined interpreter ? ? ?Cornelia Copa. MD ?  Attending ?[facility](Faculty Practice) ? ?

## 2021-08-07 NOTE — Op Note (Signed)
Operative Note  ? ?08/07/2021 ? ?PRE-OP DIAGNOSIS: Missed abortion at 6 weeks by fetal pole. Desire for long acting reversible contraception. Need for cervical cancer screening ?  ?POST-OP DIAGNOSIS: Same ? ?SURGEON: Surgeon(s) and Role: ?   * [place] Bing, MD - Primary ? ?ASSISTANT: None ? ?PROCEDURE:  Pap smear, Suction dilation and curettage, and Liletta intrauterine device placement ? ?ANESTHESIA: General and paracervical block ? ?ESTIMATED BLOOD LOSS: ? ?DRAINS: I/O cath for 65mL ? ?TOTAL IV FLUIDS: per anesthesia note ? ?SPECIMENS: cervical pap smear and  products of conception to pathology ? ?VTE PROPHYLAXIS: SCDs to the bilateral lower extremities ? ?ANTIBIOTICS: Doxycycline 100mg  IV x 1 pre op ? ?COMPLICATIONS: none ? ?DISPOSITION: PACU - hemodynamically stable. ? ?CONDITION: stable ? ?BLOOD TYPE: O POS. Rhogam given:not applicable ? ?FINDINGS: Exam under anesthesia revealed 8-10 week sized uterus with no masses and bilateral adnexa without masses or fullness. Normal EGBUS, vagina and cervix. Necrotic appearing products of conception were seen, with gritty texture in all four quadrants. Patient sounded to 10.5cm. ? ?PROCEDURE IN DETAIL:  After informed consent was obtained, the patient was taken to the operating room where anesthesia was obtained without difficulty. The patient was positioned in the dorsal lithotomy position in Akins stirrups. The patient was examined under anesthesia, with the above noted findings.  The bi-valved speculum was placed inside the patient's vagina and the pap smear obtained. The anterior lip of the cervix was seen and grasped with the tenaculum.  A paracervical block was achieved with 44mL of 1% lidocaine and then the cervix was progressively dilated to a 27 French-Pratt dilator.  The suction was then calibrated to 30m and connected to the number 9 cannula, which was then introduced with the above noted findings. A gentle curettage was done at the end and yield no  products of conception.   The suction was then done one more time to remove any remaining curettage material.  ?Excellent hemostasis was noted.  After changing gloves, the Liletta IUD was placed per manufacturer's instructions and the strings cut to 4-5cm (longer than usual).  ? ?All instruments were removed, with excellent hemostasis noted throughout.  She was then taken out of dorsal lithotomy. ?The patient tolerated the procedure well.  Sponge, lap and instrument counts were correct x2.  The patient was taken to recovery room in excellent condition. ? ? MD ?Attending ?[facility] [employer]) ? ?

## 2021-08-07 NOTE — Discharge Instructions (Addendum)
   We will discuss your surgery once again in detail at your post-op visit in two to four weeks. If you haven't already done so, please call to make your appointment as soon as possible.  Dilation and Curettage or Vacuum Curettage, Care After These instructions give you information on caring for yourself after your procedure. Your doctor may also give you more specific instructions. Call your doctor if you have any problems or questions after your procedure. HOME CARE Do not drive for 24 hours. Wait 1 week before doing any activities that wear you out. Do not stand for a long time. Limit stair climbing to once or twice a day. Rest often. Continue with your usual diet. Drink enough fluids to keep your pee (urine) clear or pale yellow. If you have a hard time pooping (constipation), you may: Take a medicine to help you go poop (laxative) as told by your doctor. Eat more fruit and bran. Drink more fluids. Take showers, not baths, for as long as told by your doctor. Do not swim or use a hot tub until your doctor says it is okay. Have someone with you for 1day after the procedure. Do not douche, use tampons, or have sex (intercourse) until seen by your doctor Only take medicines as told by your doctor. Do not take aspirin. It can cause bleeding. Keep all doctor visits. GET HELP IF: You have cramps or pain not helped by medicine. You have new pain in the belly (abdomen). You have a bad smelling fluid coming from your vagina. You have a rash. You have problems with any medicine. GET HELP RIGHT AWAY IF:  You start to bleed more than a regular period. You have a fever. You have chest pain. You have trouble breathing. You feel dizzy or feel like passing out (fainting). You pass out. You have pain in the tops of your shoulders. You have vaginal bleeding with or without clumps of blood (blood clots). MAKE SURE YOU: Understand these instructions. Will watch your condition. Will get help  right away if you are not doing well or get worse. Document Released: 01/16/2008 Document Revised: 04/13/2013 Document Reviewed: 11/05/2012 ExitCare Patient Information 2015 [employer]. This information is not intended to replace advice given to you by your health care provider. Make sure you discuss any questions you have with your health care provider.   

## 2021-08-08 ENCOUNTER — Encounter (HOSPITAL_COMMUNITY): Payer: Self-pay | Admitting: Obstetrics and Gynecology

## 2021-08-08 LAB — CYTOLOGY - PAP: Diagnosis: NEGATIVE

## 2021-08-08 LAB — SURGICAL PATHOLOGY

## 2021-08-08 NOTE — Anesthesia Postprocedure Evaluation (Signed)
Anesthesia Post Note ? ?Patient: Robin Zhang ? ?Procedure(s) Performed: DILATATION AND EVACUATION (Vagina ) ?INTRAUTERINE DEVICE (IUD) INSERTION (Vagina ) ?CHROMOSOME STUDIES (Vagina ) ? ?  ? ?Patient location during evaluation: PACU ?Anesthesia Type: General ?Level of consciousness: sedated and patient cooperative ?Pain management: pain level controlled ?Vital Signs Assessment: post-procedure vital signs reviewed and stable ?Respiratory status: spontaneous breathing ?Cardiovascular status: stable ?Anesthetic complications: no ? ? ?No notable events documented. ? ?Last Vitals:  ?Vitals:  ? 08/07/21 1015 08/07/21 1030  ?BP: 102/62 95/62  ?Pulse: 80 76  ?Resp: 14 13  ?Temp:  36.8 ?C  ?SpO2: 100% 100%  ?  ?Last Pain:  ?Vitals:  ? 08/07/21 1030  ?TempSrc:   ?PainSc: 0-No pain  ? ? ?  ?  ?  ?  ?  ?  ? ?Lewie Loron ? ? ? ? ?

## 2021-08-23 ENCOUNTER — Telehealth: Payer: Self-pay

## 2021-08-23 DIAGNOSIS — Z712 Person consulting for explanation of examination or test findings: Secondary | ICD-10-CM

## 2021-08-23 DIAGNOSIS — O039 Complete or unspecified spontaneous abortion without complication: Secondary | ICD-10-CM

## 2021-08-23 NOTE — Telephone Encounter (Signed)
CMA spoke to patient to inform on [name] miscarriage test.  ?Spanish interpreter [place] [id number] audio interpreter assist with the call.  ? 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 ? ?Alesia Banda Y ?CMA ?08/23/2021 ?

## 2021-08-24 ENCOUNTER — Ambulatory Visit: Payer: Medicaid Other

## 2021-08-28 NOTE — Telephone Encounter (Signed)
Open in error

## 2021-09-03 ENCOUNTER — Ambulatory Visit: Payer: Medicaid Other | Attending: Obstetrics and Gynecology

## 2021-09-06 ENCOUNTER — Encounter: Payer: Self-pay | Admitting: Obstetrics and Gynecology

## 2021-09-06 ENCOUNTER — Ambulatory Visit (INDEPENDENT_AMBULATORY_CARE_PROVIDER_SITE_OTHER): Payer: Medicaid Other | Admitting: Obstetrics and Gynecology

## 2021-09-06 VITALS — BP 102/60 | HR 83 | Wt 127.2 lb

## 2021-09-06 DIAGNOSIS — Z09 Encounter for follow-up examination after completed treatment for conditions other than malignant neoplasm: Secondary | ICD-10-CM

## 2021-09-06 DIAGNOSIS — Z30431 Encounter for routine checking of intrauterine contraceptive device: Secondary | ICD-10-CM

## 2021-09-06 DIAGNOSIS — Z8759 Personal history of other complications of pregnancy, childbirth and the puerperium: Secondary | ICD-10-CM

## 2021-09-06 DIAGNOSIS — Z789 Other specified health status: Secondary | ICD-10-CM

## 2021-09-06 DIAGNOSIS — Z758 Other problems related to medical facilities and other health care: Secondary | ICD-10-CM

## 2021-09-06 NOTE — Progress Notes (Signed)
Patient genetic counseling appointment rescheduled for 09/11/21 at 11 AM.

## 2021-09-06 NOTE — Progress Notes (Signed)
Obstetrics and Gynecology Visit Return Patient Evaluation  Appointment Date: 09/06/2021  Primary Care Provider: Pcp, No  OBGYN Clinic: [facility]  Chief Complaint: post op check  History of Present Illness:  Robin Zhang is a 26 y.o. [id number] s/p 4/18 suction d&c, Liletta IUD placement and pap smear for 6wk missed AB   Interval History: Since that time, she states that she is doing well. She has had sexual intercourse two weeks ago and had some cramping after that. No period  Review of Systems: as noted in the History of Present Illness.   Patient Active Problem List   Diagnosis Date Noted   Retained products of conception after delivery without hemorrhage 12/19/2020   Miscarriage 12/11/2020   Medications:  Robin Zhang had no medications administered during this visit. Current Outpatient Medications  Medication Sig Dispense Refill   levonorgestrel (LILETTA, 52 MG,) 20.1 MCG/DAY IUD 1 each by Intrauterine route once.     No current facility-administered medications for this visit.    Allergies: has No Known Allergies.  Physical Exam:  BP 102/60   Pulse 83   Wt 127 lb 3.2 oz (57.7 kg)   LMP 05/01/2021 Comment: SAB  Breastfeeding Unknown   BMI 23.27 kg/m  Body mass index is 23.27 kg/m. General appearance: Well nourished, well developed female in no acute distress.  Abdomen: diffusely non tender to palpation, non distended, and no masses, hernias Neuro/Psych:  Normal mood and affect.    Pelvic exam:  EGBUS: normal Vaginal vault: normal Cervix: normal. IUD strings 3-4cm coming from os Bimanual: negative   Assessment: pt doing well  Plan:  1. Postop check Doing well  2. History of miscarriage Robin Zhang came back t18. Pt did not know about her mfm appt with genetics this past Monday. Another one was made  3. IUD check up No issues  Interpreter used  RTC: PRN  Durene Romans MD Attending Center for [employer]  [facility])

## 2021-09-06 NOTE — Patient Instructions (Signed)
Appointment 09/11/21 at 11 AM

## 2021-09-11 ENCOUNTER — Ambulatory Visit: Payer: Medicaid Other | Attending: Maternal & Fetal Medicine | Admitting: Obstetrics and Gynecology

## 2021-09-11 ENCOUNTER — Other Ambulatory Visit: Payer: Self-pay

## 2021-09-11 ENCOUNTER — Ambulatory Visit: Payer: Self-pay

## 2021-09-11 DIAGNOSIS — Z8279 Family history of other congenital malformations, deformations and chromosomal abnormalities: Secondary | ICD-10-CM | POA: Diagnosis not present

## 2021-09-11 DIAGNOSIS — O039 Complete or unspecified spontaneous abortion without complication: Secondary | ICD-10-CM

## 2021-09-11 NOTE — Progress Notes (Signed)
Referring Provider:  Dr. Lowell Guitar of Consultation:  40 minutes  I connected with  Robin Zhang on 09/11/21 by a video enabled telemedicine application and verified that I am speaking with the correct person using two identifiers. The patient was at home and the genetic counselor was in the [facility] clinic at [facility].   I discussed the limitations of evaluation and management by telemedicine. The patient expressed understanding and agreed to proceed.   Robin Zhang was referred to [facility] for genetic counseling to discuss the result of the chromosomal microarray performed on her recent pregnancy loss which revealed trisomy 73. We also reviewed screening and testing options which would be available in any future pregnancy due to this history. This note summarizes the information we discussed with the patient with the aid of a Spanish interpreter.   We reviewed that chromosomes are the inherited structures that contain our instructions for development (genes). Each cell of our body normally has 46 chromosomes, matched up into 23 pairs. The last pair determines our gender and are called the sex chromosomes. A female has an X and a Y chromosome, while a female has two X chromosomes. Rarely, when a mother's egg and father's sperm unite, an extra or missing chromosome can be passed on to the baby by mistake. Changes in the number or the structure of the chromosomes may result in a child with some degree of intellectual disabilities and physical problems.    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 No other copy number differences were noted on this report. The report also states parent of origin, which is maternal, though it would not be expected to change the  counseling in the absence of a familial translocation. The report also states fetal gender, which is female.   It is estimated that 45% of first trimester miscarriages are found to have a chromosome abnormality.  Trisomy 16 is among the most common chromosome differences seen in first trimester pregnancy losses and is not typically compatible with survival to term. It most often occurs due to nondisjunction, or abnormal separation, of the chromosomes during production of the egg or sperm. Trisomy may also occur as the result of a chromosome translocation in a parent, though this is much less common. The microarray results did not show any other imbalance to suggest involvement of other chromosomes which would indicate a higher chance for a translocation and there is no family history of recurrent pregnancy loss or anomalies. In the absence of a chromosome translocation, the recurrence for any aneuploidy for this couple is estimated to be 1% or the maternal age related risk.  Because Robin Zhang is only 26 years old, we would estimate this risk of 1%.  Should she be greater than 47 years old at the time of any future pregnancy, then the risk would be equivalent to that of her age.  Though the patient stated that she plans to wait several years before considering another pregnancy, we reviewed the following screening or testing options for any future pregnancy:   The most accurate noninvasive screening option for chromosome conditions is cell free fetal DNA testing. This test utilizes a maternal blood sample and DNA sequencing technology to isolate circulating cell free fetal DNA from maternal plasma. The fetal DNA can  then be analyzed for DNA sequences that are derived from the three most common chromosomes involved in aneuploidy, chromosomes 13, 18, and 21. If the overall amount of DNA is greater than the expected level for any of these chromosomes, aneuploidy is suspected. The detection rates are >99% for Down  syndrome, >98% for trisomy 18 and >91% for Trisomy 13. While we do not consider it a replacement for invasive testing and karyotype analysis, a negative result from this testing would be reassuring, though not a guarantee of a normal chromosome complement for the baby. An abnormal result may be suggestive of an abnormal chromosome complement, though we would still recommend CVS or amniocentesis to confirm any findings from this testing.  It is important to remember that this screening option will only assess for chromosomes 18, 13, 21, X and Y.  It cannot screen for all chromosome conditions.   Targeted ultrasound uses high frequency sound waves to create an image of the developing fetus. An ultrasound is often recommended as a routine means of evaluating the pregnancy. It is also used to screen for fetal anatomy problems (for example, a heart defect) that might be suggestive of a chromosomal or other abnormality. We are currently not recommending a first trimester ultrasound other than that which would be ordered for dating and viability.    The chorionic villus sampling procedure is available for first trimester chromosome analysis. This involves the withdrawal of a small amount of chorionic villi (tissue from the developing placenta). Risk of pregnancy loss is estimated to be less than 1 in 200. There is approximately a 1% (1 in 100) chance that the CVS chromosome results will be unclear. Chorionic villi cannot be tested for neural tube defects.    Amniocentesis involves the removal of a small amount of amniotic fluid from the sac surrounding the fetus with the use of a thin needle inserted through the maternal abdomen and uterus. Ultrasound guidance is used throughout the procedure. Fetal cells from amniotic fluid are directly evaluated and > 99.5% of chromosome problems and > 98% of open neural tube defects can be detected. This procedure is generally performed after the 15th week of pregnancy. The main  risks to this procedure include complications leading to miscarriage in less than 1 in 500 cases.    Routine screening: Carrier screening for recessive genetic conditions was also discussed with the patient.  This includes hemoglobinopathies, Cystic Fibrosis and Spinal Muscular Atrophy (SMA). All these conditions are recessive, which means that both parents must be carriers in order to have a child with the disease. Cystic fibrosis (CF) is one of the most common genetic conditions in persons of Caucasian ancestry. This condition occurs in approximately 1 in 2,500 Caucasian persons and results in thickened secretions in the lungs, digestive, and reproductive systems. Approximately 1 in 8325 Caucasian persons is a carrier for CF. Current carrier testing looks for the most common mutations in the gene for CF and can detect approximately 90% of carriers in the Caucasian population. This means that the carrier screening can greatly reduce, but cannot eliminate, the chance for an individual to have a child with CF. If an individual is found to be a carrier for CF, then carrier testing would be available for the partner. As part of [place] newborn screening profile, all babies born in the state of [place]. SMA is also more prevalent in the Caucasian population, with 1 in 40-1 in 60 persons being a carrier and 1 in 6,000-1 in  10,000 children being affected. There are multiple forms of the disease, with some causing death in infancy to other forms with survival into adulthood. The genetics of SMA is complex, but carrier screening can detect up to 95% of carriers in the Caucasian population. Similar to CF, a negative result can greatly reduce, but cannot eliminate, the chance to have a child with SMA.  Hemoglobinopathies are a group of inherited blood disorders that are also recessive.  Each of these conditions is included in the newborn screening testing for all babies born in [place].   We obtained  a detailed family history and pregnancy history.  Robin Zhang has two healthy sons, ages 25 and 3 years, from a prior relationship.  The father of the baby also has a healthy son from a prior relationship.  Together, the couple has had two pregnancies, both resulting in first trimester spontaneous abortion. Genetic testing was only performed on this most recent loss.  We did review that there may be various causes for recurrent pregnancy loss but did not pursue additional testing at this time, as there has only been 1 unexplained loss.  Should this couple experience additional miscarriages in the future, we are happy to revisit further evaluation.  In the family history, the patient reported that the father of the baby's mother had a pregnancy loss at 6-7 months gestation.  No details are known about possible anomalies or the cause for that loss.  Without more details about that loss, it is difficult to determine any increased risks for other family members to have a pregnancy with a similar outcome. The patient and her partner are both [place] and are not related to each other. The remainder of the family history was reported to be unremarkable for birth defects, intellectual delays, recurrent pregnancy loss or known chromosome abnormalities.    Plan of Care: The patient was encouraged to reach out in any future pregnancy at which time we can plan for the desired testing at the appropriate gestational age.  If carrier screening is desired, the patient can let us know and we are happy to facilitate this testing prior to or during any future pregnancy.   We may be reached at [phone].    Cherly Anderson, MS, CGC

## 2022-01-09 ENCOUNTER — Other Ambulatory Visit (HOSPITAL_COMMUNITY)
Admission: RE | Admit: 2022-01-09 | Discharge: 2022-01-09 | Disposition: A | Discharge status: Home / Self Care | Payer: Medicaid Other | Source: Ambulatory Visit | Attending: Family Medicine | Admitting: Family Medicine

## 2022-01-09 ENCOUNTER — Other Ambulatory Visit: Payer: Self-pay

## 2022-01-09 ENCOUNTER — Ambulatory Visit (INDEPENDENT_AMBULATORY_CARE_PROVIDER_SITE_OTHER): Payer: Medicaid Other | Admitting: General Practice

## 2022-01-09 VITALS — BP 111/66 | HR 78 | Ht 62.0 in | Wt 127.0 lb

## 2022-01-09 DIAGNOSIS — N898 Other specified noninflammatory disorders of vagina: Secondary | ICD-10-CM | POA: Diagnosis present

## 2022-01-09 NOTE — Progress Notes (Signed)
Patient presents to office today reporting thick, clumpy, discharge with odor x 2-3 weeks. She reports having an IUD and wonders if something is wrong with that. Discussed with patient IUDs can sometimes cause an increase in simple vaginal infections but reassured her there isn't anything concerning occurring. Instructed patient in self swab & specimen collected. Advised patient results will be available in mychart in 24-48 hours and we will send in any needed prescriptions. Patient verbalized understanding.   Koren Bound RN BSN 01/09/22

## 2022-01-10 LAB — CERVICOVAGINAL ANCILLARY ONLY
Bacterial Vaginitis (gardnerella): POSITIVE — AB
Candida Glabrata: NEGATIVE
Candida Vaginitis: NEGATIVE
Chlamydia: NEGATIVE
Comment: NEGATIVE
Comment: NEGATIVE
Comment: NEGATIVE
Comment: NEGATIVE
Comment: NEGATIVE
Comment: NORMAL
Neisseria Gonorrhea: NEGATIVE
Trichomonas: NEGATIVE

## 2022-01-11 ENCOUNTER — Other Ambulatory Visit: Payer: Self-pay | Admitting: Family Medicine

## 2022-01-11 DIAGNOSIS — B9689 Other specified bacterial agents as the cause of diseases classified elsewhere: Secondary | ICD-10-CM

## 2022-01-11 MED ORDER — METRONIDAZOLE 500 MG PO TABS: 500.0000 mg | ORAL_TABLET | Freq: Two times a day (BID) | ORAL | 0 refills | Status: DC

## 2022-01-14 ENCOUNTER — Telehealth: Payer: Self-pay | Admitting: Family Medicine

## 2022-01-14 NOTE — Telephone Encounter (Signed)
Called pt and informed her of recent test result showing presence of BV.  Antibiotic prescription has been sent to her pharmacy.  Pt voiced understanding and had no questions.

## 2022-01-14 NOTE — Telephone Encounter (Signed)
Patient called in wanting to know the results from her recent self swab.

## 2022-06-25 ENCOUNTER — Other Ambulatory Visit (HOSPITAL_COMMUNITY)
Admission: RE | Admit: 2022-06-25 | Discharge: 2022-06-25 | Disposition: A | Discharge status: Home / Self Care | Payer: Medicaid Other | Source: Ambulatory Visit | Attending: Family Medicine | Admitting: Family Medicine

## 2022-06-25 ENCOUNTER — Ambulatory Visit (INDEPENDENT_AMBULATORY_CARE_PROVIDER_SITE_OTHER): Payer: Medicaid Other

## 2022-06-25 DIAGNOSIS — N898 Other specified noninflammatory disorders of vagina: Secondary | ICD-10-CM | POA: Diagnosis present

## 2022-06-25 NOTE — Progress Notes (Signed)
With [employer], pt reports that she has a rash that she also had with discharge that is white thick to yellowish back to whitish.  No itching or odor.  Pt explained how to obtain self swab and that we will call with abnormal results.    Frances Nickels

## 2022-06-26 ENCOUNTER — Encounter: Payer: Self-pay | Admitting: Family Medicine

## 2022-06-26 ENCOUNTER — Other Ambulatory Visit: Payer: Self-pay | Admitting: Family Medicine

## 2022-06-26 LAB — CERVICOVAGINAL ANCILLARY ONLY
Bacterial Vaginitis (gardnerella): POSITIVE — AB
Candida Glabrata: NEGATIVE
Candida Vaginitis: NEGATIVE
Chlamydia: NEGATIVE
Comment: NEGATIVE
Comment: NEGATIVE
Comment: NEGATIVE
Comment: NEGATIVE
Comment: NEGATIVE
Comment: NORMAL
Neisseria Gonorrhea: NEGATIVE
Trichomonas: NEGATIVE

## 2022-06-26 MED ORDER — METRONIDAZOLE 0.75 % VA GEL: 1.0000 | Freq: Every day | VAGINAL | 0 refills | Status: AC

## 2022-06-28 ENCOUNTER — Telehealth: Payer: Self-pay

## 2022-06-28 NOTE — Telephone Encounter (Addendum)
-----   Message from Robin Flock, MD sent at 06/26/2022  4:27 PM EST ----- Spanish speaking Please notify of BV, I have sent metrogel to her pharmacy  Called pt with interpreter Rosemarie Ax; results and medication reviewed.

## 2022-07-03 ENCOUNTER — Telehealth: Payer: Self-pay | Admitting: Family Medicine

## 2022-07-03 NOTE — Telephone Encounter (Signed)
Left message that we are returning her if she continues to have questions or concerns please give [place] a call back.   Frances Nickels  07/03/22

## 2022-07-03 NOTE — Telephone Encounter (Signed)
Patient is calling to talk about her treatment and questions that she has, would like a call back.

## 2022-08-08 ENCOUNTER — Ambulatory Visit: Payer: Medicaid Other | Admitting: Obstetrics and Gynecology

## 2022-09-02 ENCOUNTER — Other Ambulatory Visit: Payer: Self-pay

## 2022-09-02 ENCOUNTER — Ambulatory Visit (INDEPENDENT_AMBULATORY_CARE_PROVIDER_SITE_OTHER): Payer: Medicaid Other

## 2022-09-02 VITALS — BP 100/63 | HR 73 | Ht 62.0 in | Wt 127.1 lb

## 2022-09-02 DIAGNOSIS — Z01419 Encounter for gynecological examination (general) (routine) without abnormal findings: Secondary | ICD-10-CM | POA: Diagnosis not present

## 2022-09-02 NOTE — Progress Notes (Signed)
   Subjective:     Robin Zhang is a 27 y.o. female here at [facility] for a routine exam. Current complaints: none. Requests spec exam to make sure IUD is in place   [employer] Visit from 12/19/2020 in [facility] for [employer] at [facility]  PHQ-2 Total Score 0       Health Maintenance Due  Topic Date Due   COVID-19 Vaccine (1) Never done   HPV VACCINES (1 - 2-dose series) Never done   HIV Screening  Never done   Hepatitis C Screening  Never done   DTaP/Tdap/Td (1 - Tdap) Never done    Risk factors for chronic health problems: Smoking: denies Alchohol/how much: denies Illicit drug use: denies Pt BMI: Body mass index is 23.25 kg/m.   Gynecologic History No LMP recorded. (Menstrual status: IUD). Contraception: IUD Sexual health: 1 female partner, no issues Last Pap: 08/07/2021. Results were: NILM   Obstetric History OB History  Gravida Para Term Preterm AB Living  5 2 2   2 2   SAB IAB Ectopic Multiple Live Births  2       2    # Outcome Date GA Lbr Len/2nd Weight Sex Delivery Anes PTL Lv  5 Gravida           4 SAB 12/21/20          3 SAB 10/2020 [redacted]w[redacted]d         2 Term 2020    M CS-LTranv   LIV  1 Term 2016    M CS-LTranv   LIV   The following portions of the patient's history were reviewed and updated as appropriate: allergies, current medications, past family history, past medical history, past social history, past surgical history, and problem list.  Review of Systems Pertinent items are noted in HPI.    Objective:  BP 100/63   Pulse 73   Ht 5\' 2"  (1.575 m)   Wt 127 lb 1.6 oz (57.7 kg)   BMI 23.25 kg/m   VS reviewed, nursing note reviewed,  Constitutional: well developed, well nourished, no distress HEENT: normocephalic, thyroid without enlargement or mass HEART: RRR, no murmurs rubs/gallops RESP: clear and equal to auscultation bilaterally in all lobes  Breast Exam: Deferred with low risks and shared decision making, discussed  recommendation to start mammogram between 40-50 yo/ exam  Abdomen: soft Neuro: alert and oriented x 3 Skin: warm, dry Psych: affect normal Pelvic exam: Normal external female genitalia, vaginal walls pink and well-rugated, small amount of white creamy discharge, cervix visually closed without lesions/masses, 2 IUD strings visualized    Assessment/Plan:   1. Well woman exam - Normal well woman exam - Pap up to date - IUD in place - Declines STI screening   Return in about 1 year (around 09/02/2023) for well woman exam or sooner prn.   Brand Males, CNM 8:53 AM

## 2023-01-06 IMAGING — US US OB COMP LESS 14 WK
1 series · 15 of 28 positions shown · non-contrast
Comparison: None.

CLINICAL DATA: Vaginal bleeding in 1st trimester pregnancy.

EXAM:
OBSTETRIC <14 WK US AND TRANSVAGINAL OB US
TECHNIQUE: Both transabdominal and transvaginal ultrasound examinations were
performed for complete evaluation of the gestation as well as the
maternal uterus, adnexal regions, and pelvic cul-de-sac.
Transvaginal technique was performed to assess early pregnancy.

[Series 1: us ob comp less 14 wk · 15 of 74 slices shown]
[im 1/74]
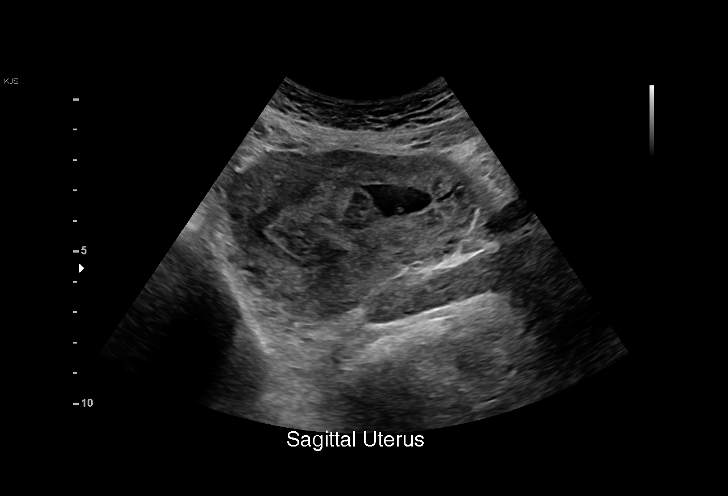
[im 6/74]
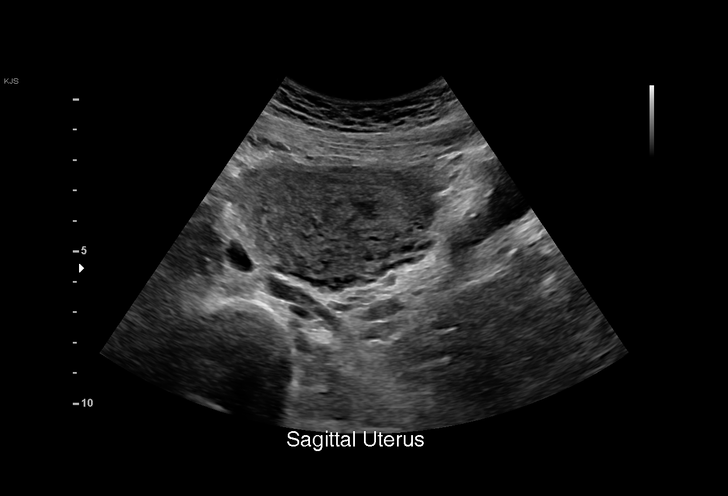
[im 11/74]
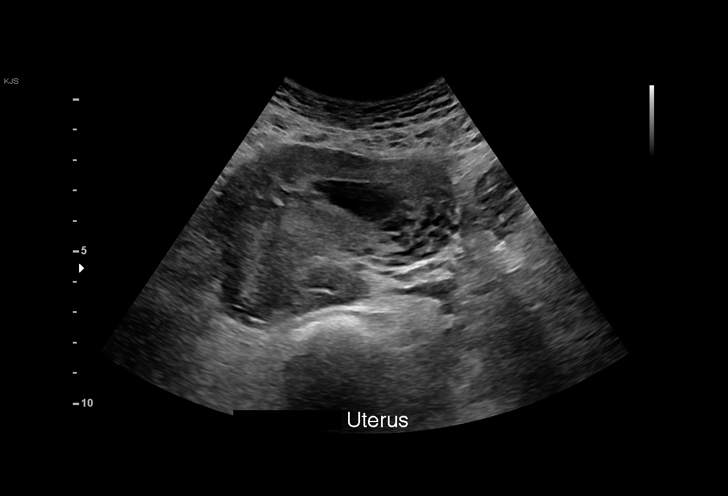
[im 17/74]
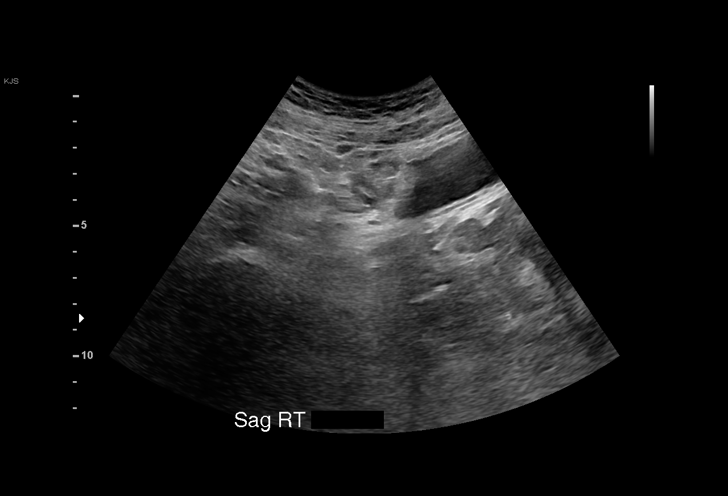
[im 22/74]
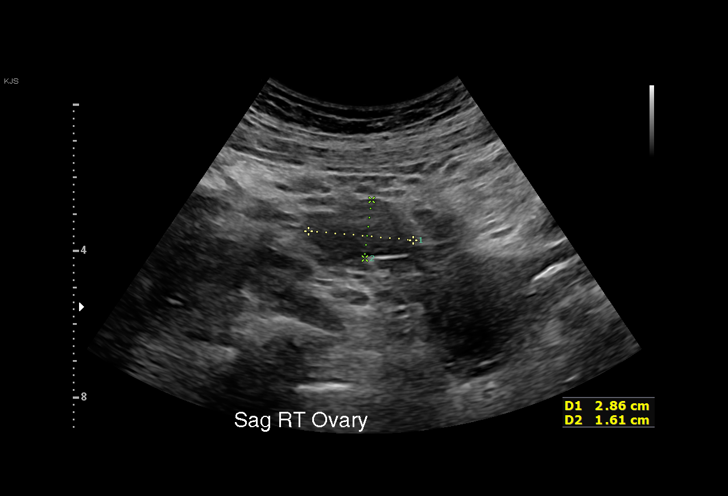
[im 28/74]
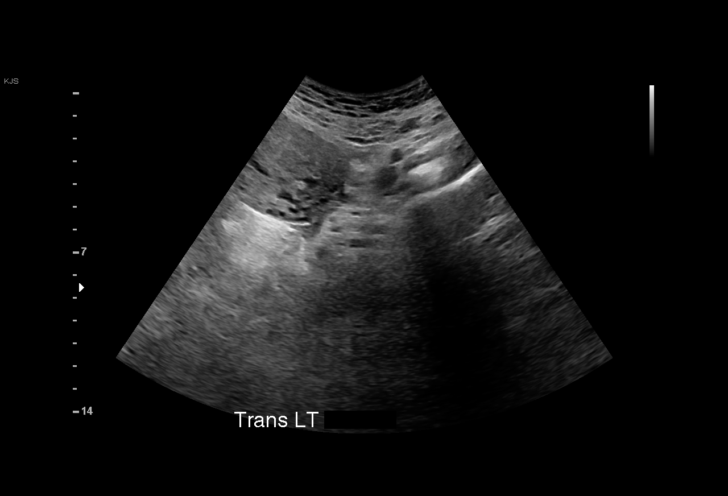
[im 33/74]
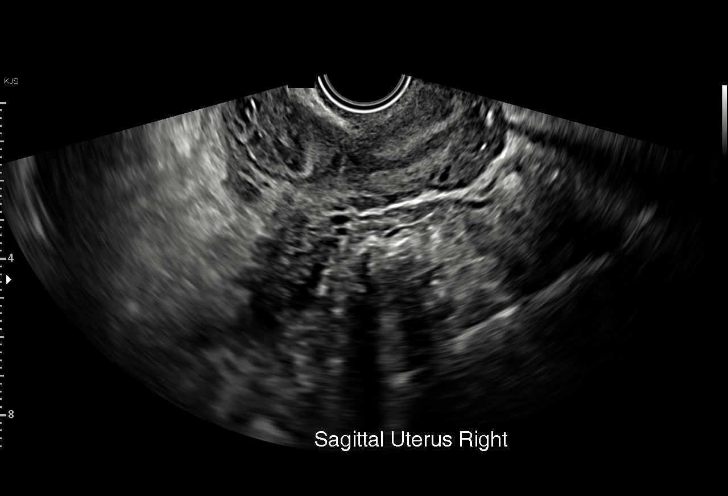
[im 38/74]
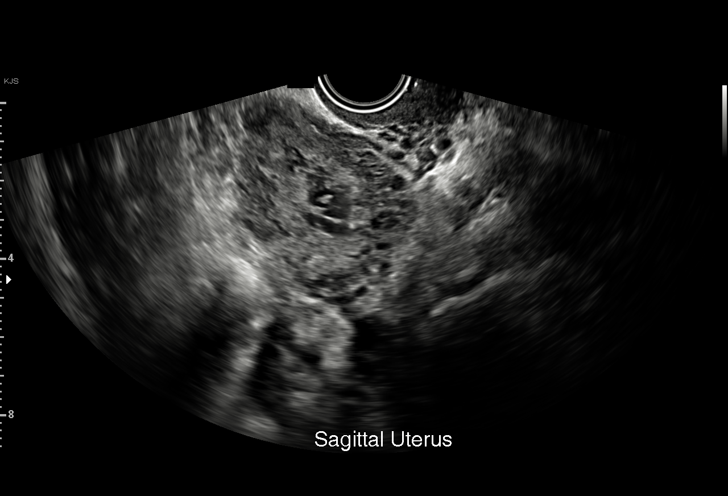
[im 41/74]
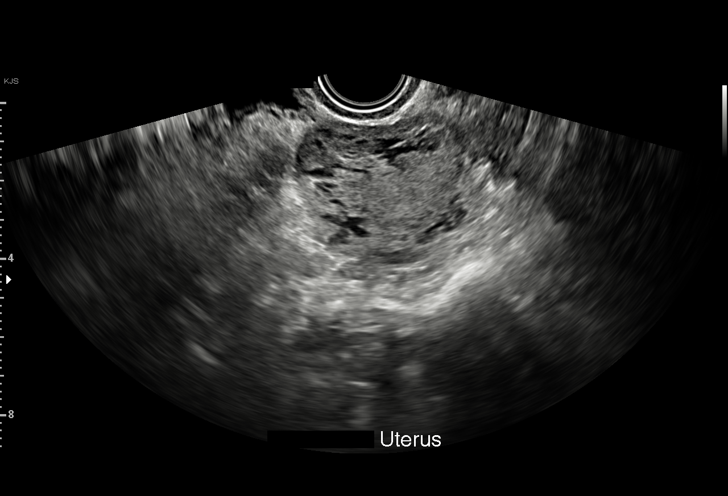
[im 46/74]
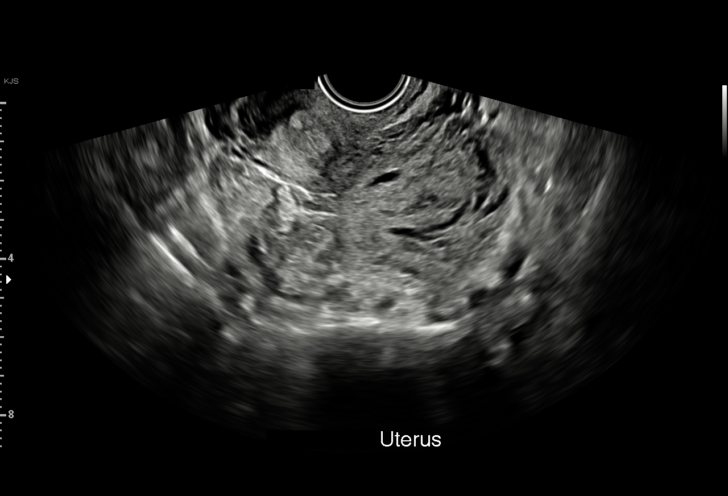
[im 52/74]
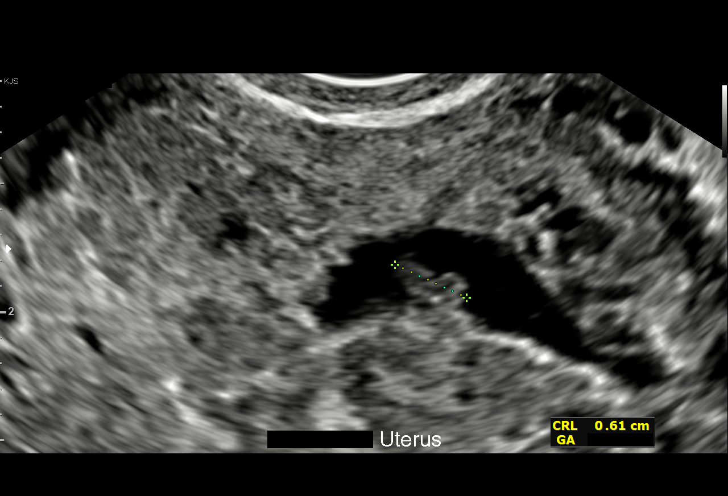
[im 57/74]
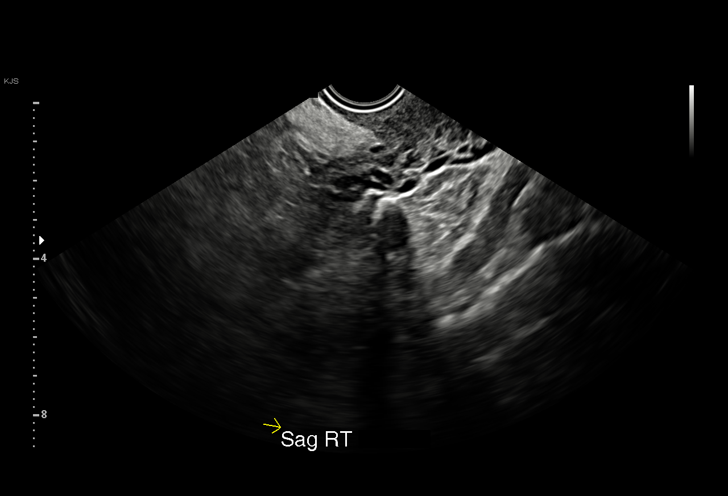
[im 63/74]
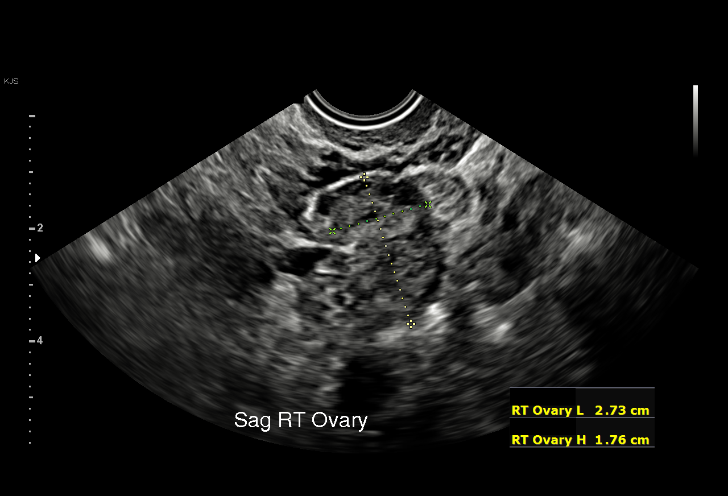
[im 68/74]
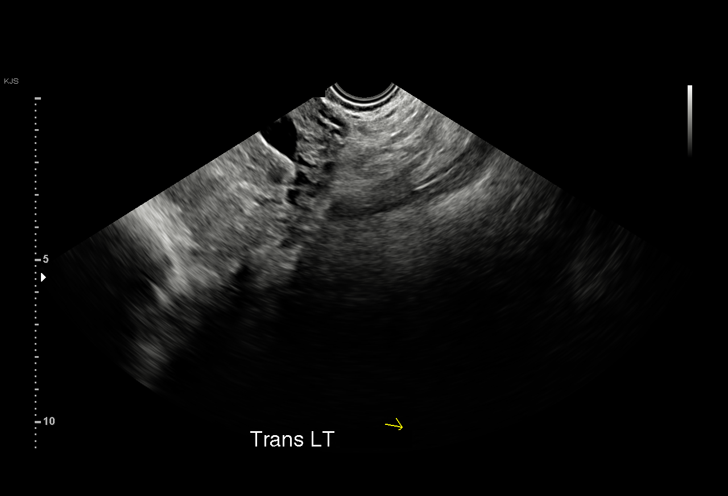
[im 74/74]
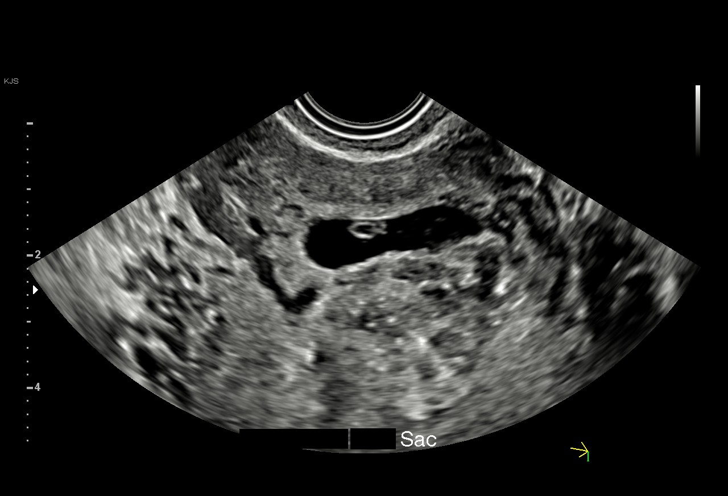

[15 of 28 positions shown; findings below may reference images not displayed]

FINDINGS: Intrauterine gestational sac: Single, with irregular shape and
location in lower uterine segment noted

Yolk sac:  Visualized.

Embryo:  Visualized.

Cardiac Activity: Not Visualized.

CRL:  6 mm   6 w   2 d                  US EDC: 06/17/2021

Subchorionic hemorrhage:  Small subchorionic hemorrhage seen.

Maternal uterus/adnexae: Uterus is retroflexed. Both ovaries are
normal in appearance. No mass or abnormal free fluid identified.
IMPRESSION: Findings are suspicious but not yet definitive for failed pregnancy.
Recommend follow-up US in 7 days for definitive diagnosis. This
recommendation follows SRU consensus guidelines: Diagnostic Criteria
for Nonviable Pregnancy Early in the First Trimester. N Engl J Med

## 2023-03-02 IMAGING — US US OB TRANSVAGINAL
2 series · 14 of 28 positions shown · non-contrast
Comparison: Ultrasound 10/24/2020, 11/01/2020

CLINICAL DATA: Dysfunctional uterine bleeding following recent
miscarriage in Tuesday October, 2020

EXAM:
TRANSVAGINAL OB ULTRASOUND
TECHNIQUE: Transvaginal ultrasound was performed for complete evaluation of the
gestation as well as the maternal uterus, adnexal regions, and
pelvic cul-de-sac.

[Series 1: us ob transvaginal · 13 of 51 slices shown (1 of 2)]
[im 2/51]
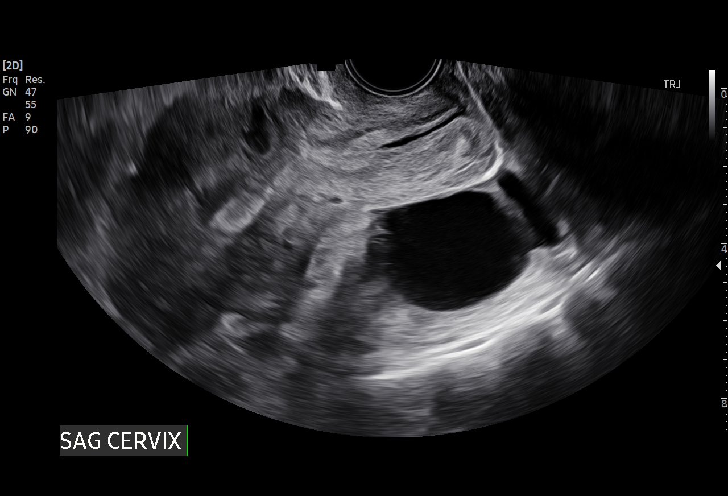
[im 6/51]
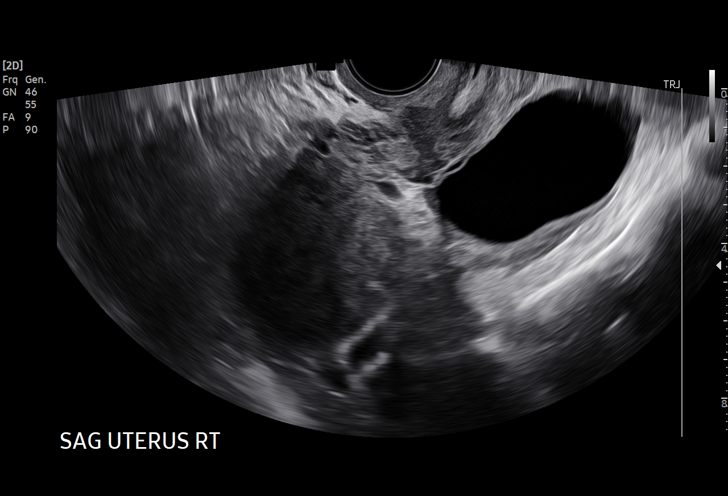
[im 10/51]
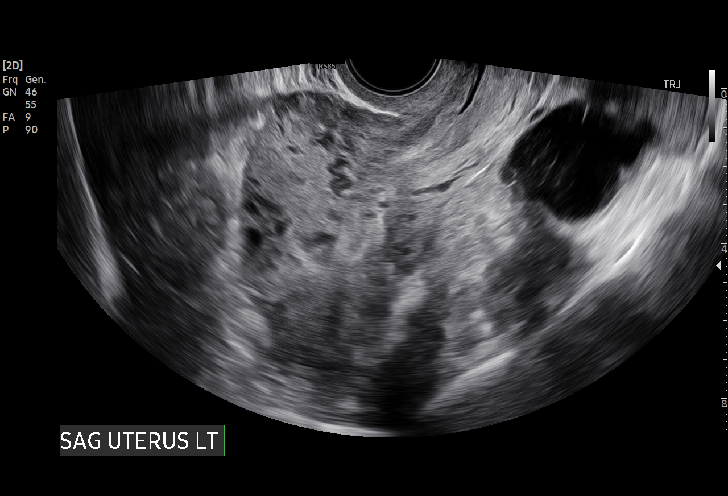
[im 14/51]
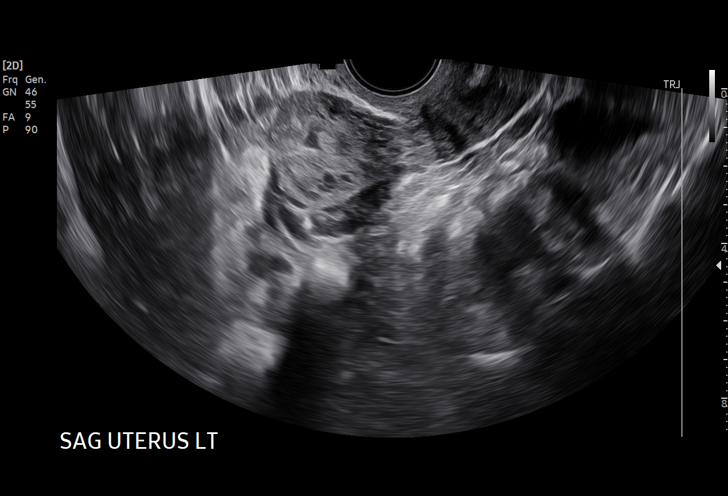
[im 18/51]
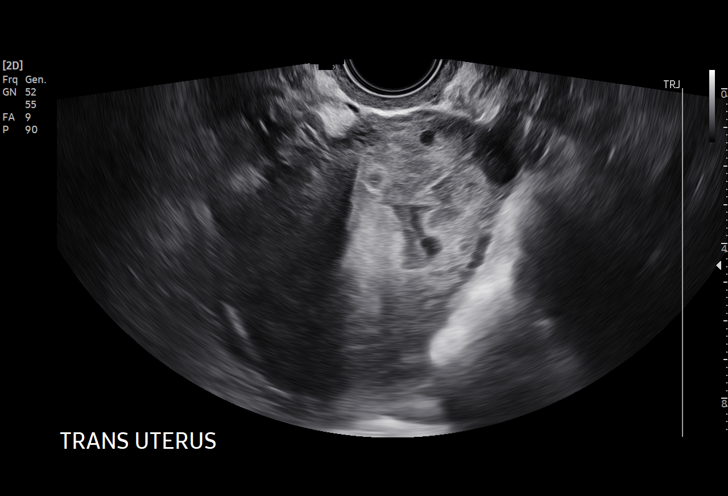
[im 22/51]
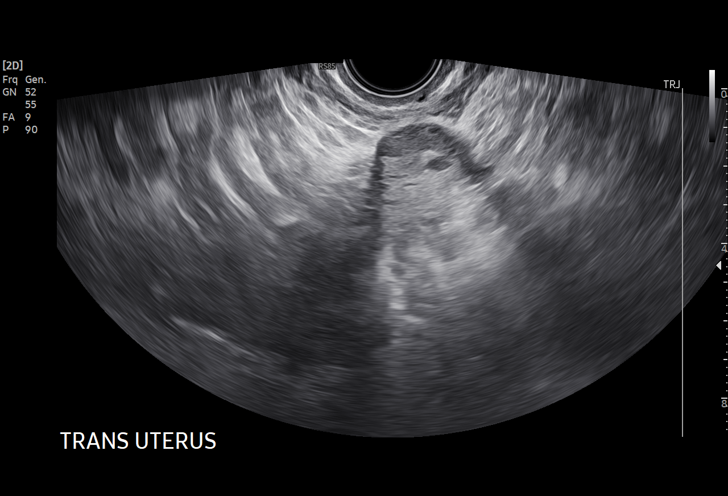
[im 26/51]
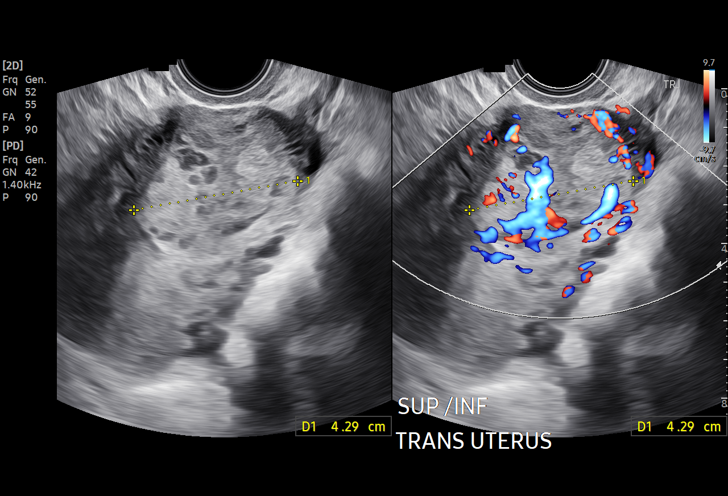
[im 29/51]
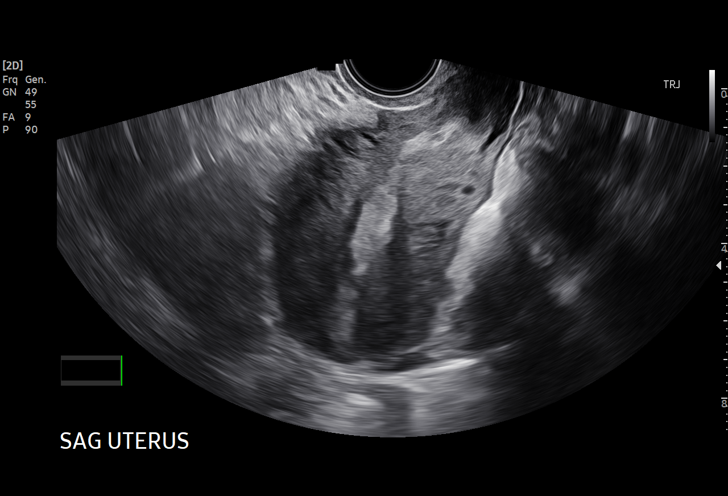
[im 33/51]
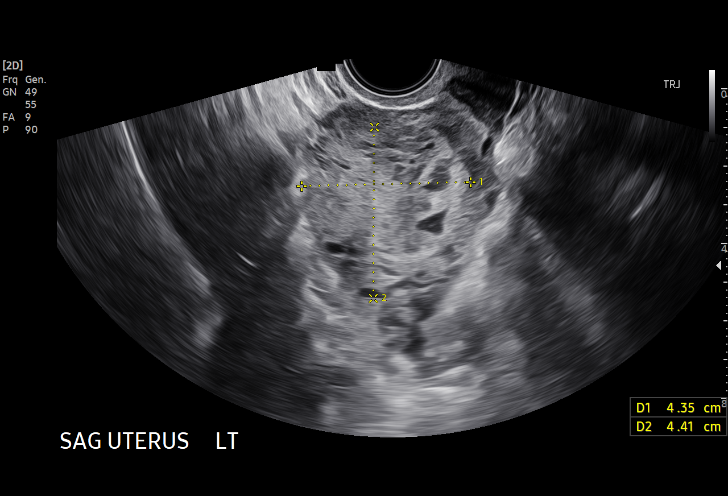
[im 37/51]
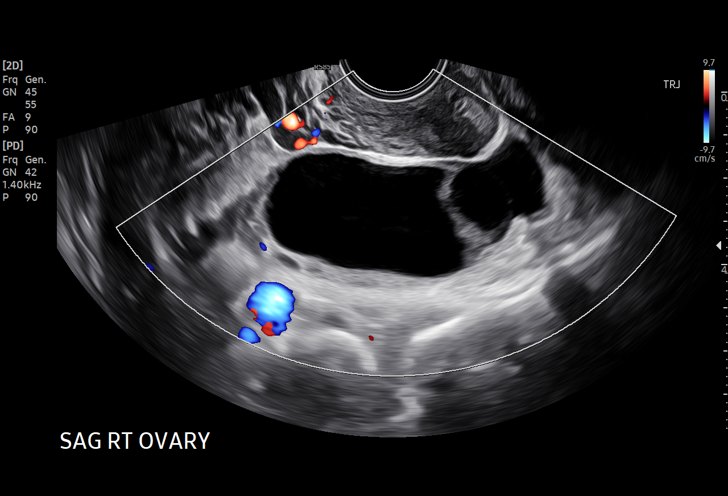
[im 41/51]
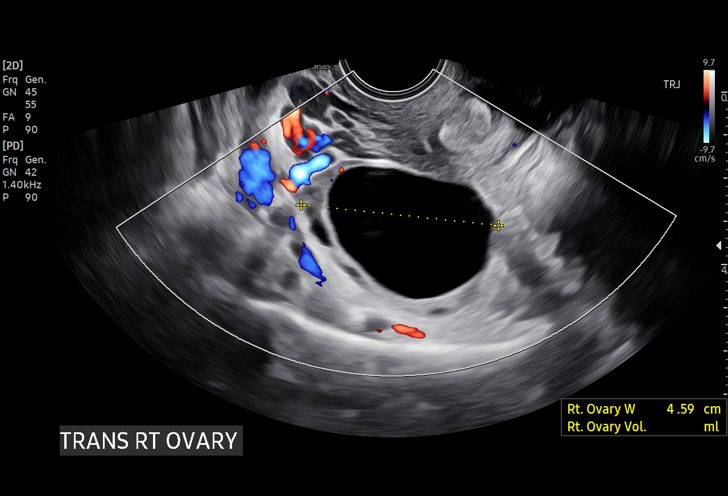
[im 45/51]
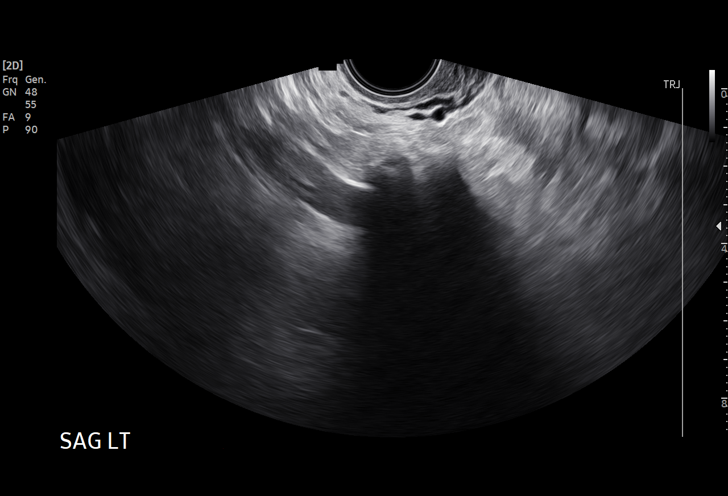
[im 49/51]
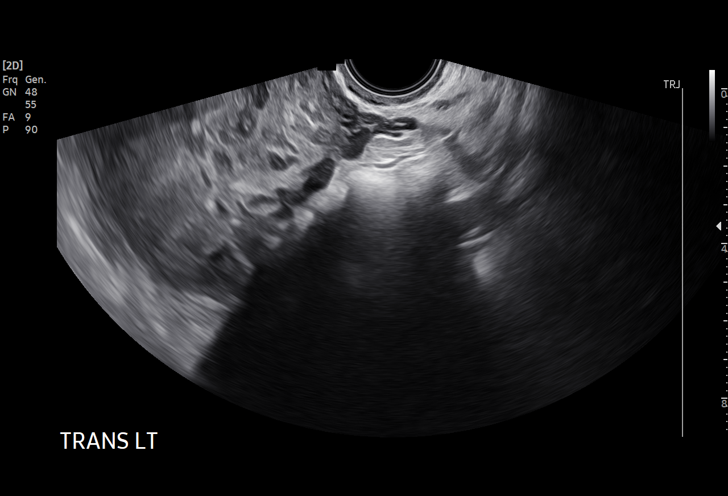

[Series 1: us ob transvaginal · 1 of 1 slices shown (2 of 2)]
[im 1/1]
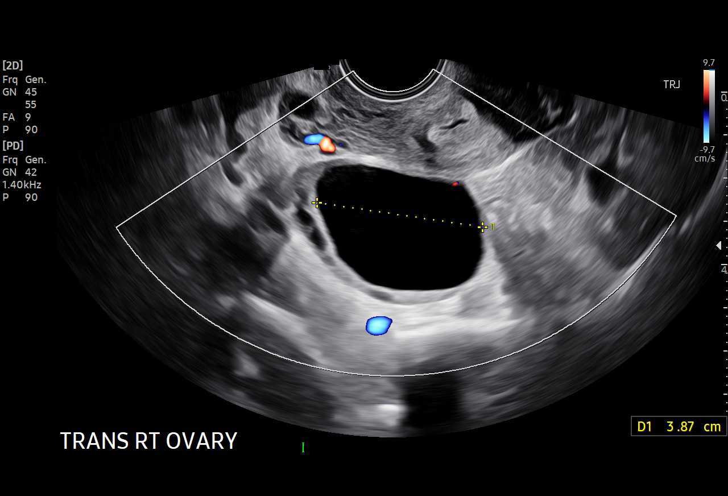

[14 of 28 positions shown; findings below may reference images not displayed]

FINDINGS: Intrauterine gestational sac: Previously seen gestational sac is no
longer visualized.

Maternal uterus/adnexae: There is a new complex heterogeneous mass
along the left side of the uterus which appears to be arising from
the endometrial stripe measuring approximately 4.4 x 4.3 x 4.4 cm.
This area is markedly hypervascular with prominent vascular
channels.

Elongated fluid structures within the right ovary which may
represent right uterine cysts or possibly reflect dilated fallopian
tube adjacent to the right ovary. No internal complexity.

Left ovary was not visualized.

Trace free fluid within the cul-de-sac.
IMPRESSION: 1. No intrauterine pregnancy identified.
2. There is a new 4.4 cm hypervascular mass within the uterus
adjacent to the endometrial stripe. Findings may represent retained
products of conception or possibly a molar pregnancy. Correlation
with serial beta HCG levels, short-term follow-up ultrasound, and
close OB follow-up is recommended.
3. Right adnexal cysts or possibly hydrosalpinx. Attention on
follow-up.

These results will be called to the ordering clinician or
representative by the Radiologist Assistant, and communication
documented in the PACS or [REDACTED].

## 2023-03-11 ENCOUNTER — Telehealth: Payer: Self-pay | Admitting: Family Medicine

## 2023-03-11 NOTE — Telephone Encounter (Signed)
Patient is calling in regarding a bad odor, she states she has an IUD and is not sure if that is what causing this but its been 3 weeks.

## 2023-03-12 ENCOUNTER — Ambulatory Visit: Payer: Medicaid Other | Admitting: *Deleted

## 2023-03-12 ENCOUNTER — Other Ambulatory Visit (HOSPITAL_COMMUNITY)
Admission: RE | Admit: 2023-03-12 | Discharge: 2023-03-12 | Disposition: A | Discharge status: Home / Self Care | Payer: Medicaid Other | Source: Ambulatory Visit | Attending: Family Medicine | Admitting: Family Medicine

## 2023-03-12 ENCOUNTER — Other Ambulatory Visit: Payer: Self-pay

## 2023-03-12 VITALS — BP 109/67 | HR 76 | Ht 62.0 in | Wt 132.1 lb

## 2023-03-12 DIAGNOSIS — N898 Other specified noninflammatory disorders of vagina: Secondary | ICD-10-CM | POA: Diagnosis present

## 2023-03-12 NOTE — Telephone Encounter (Signed)
I called pt with interpreter Eda Royal. She stated that she does not have vaginal itching, discharge or pain - just odor for 3 weeks.  Pt was offered Nurse visit appt today @ 2:00 pm. She is not able to come @ that time due to work schedule and needing to pick up children from 2 different schools. Pt stated that she can come in @ 4:00pm today.  Appt scheduled.

## 2023-03-12 NOTE — Progress Notes (Signed)
Pt here for self swab following phone call earlier today wherein she had reported vaginal odor x3 weeks. She denies discharge or pelvic pain. Self swab obtained.  Pt was advised that she will be notified of results and treatment indicated.  Pt reported that she is not able to get into her Mychart @ this time and requested to receive results by phone.

## 2023-03-14 LAB — CERVICOVAGINAL ANCILLARY ONLY
Bacterial Vaginitis (gardnerella): POSITIVE — AB
Candida Glabrata: NEGATIVE
Candida Vaginitis: NEGATIVE
Chlamydia: NEGATIVE
Comment: NEGATIVE
Comment: NEGATIVE
Comment: NEGATIVE
Comment: NEGATIVE
Comment: NEGATIVE
Comment: NORMAL
Neisseria Gonorrhea: NEGATIVE
Trichomonas: NEGATIVE

## 2023-03-16 ENCOUNTER — Other Ambulatory Visit: Payer: Self-pay | Admitting: Obstetrics and Gynecology

## 2023-03-16 DIAGNOSIS — B9689 Other specified bacterial agents as the cause of diseases classified elsewhere: Secondary | ICD-10-CM

## 2023-03-16 MED ORDER — METRONIDAZOLE 500 MG PO TABS: 500.0000 mg | ORAL_TABLET | Freq: Two times a day (BID) | ORAL | 0 refills | Status: AC

## 2023-03-17 ENCOUNTER — Telehealth: Payer: Self-pay

## 2023-03-17 NOTE — Telephone Encounter (Signed)
Called Pt using Spanish [address] [place] id # [id number], to go over + BV results & that Rx was sent to pharmacy. Also went over ways to prevent infection also. Pt verbalized understanding.

## 2023-03-17 NOTE — Telephone Encounter (Signed)
-----   Message from [facility] sent at 03/16/2023 10:42 PM EST ----- Can we notify of results,treatment sent to pharmacy, thanks!

## 2023-06-25 ENCOUNTER — Other Ambulatory Visit (HOSPITAL_COMMUNITY)
Admission: RE | Admit: 2023-06-25 | Discharge: 2023-06-25 | Disposition: A | Discharge status: Home / Self Care | Source: Ambulatory Visit | Attending: Family Medicine | Admitting: Family Medicine

## 2023-06-25 ENCOUNTER — Other Ambulatory Visit: Payer: Self-pay

## 2023-06-25 ENCOUNTER — Ambulatory Visit: Payer: Medicaid Other | Admitting: *Deleted

## 2023-06-25 VITALS — BP 106/67 | HR 75 | Ht 63.0 in | Wt 132.5 lb

## 2023-06-25 DIAGNOSIS — N941 Unspecified dyspareunia: Secondary | ICD-10-CM | POA: Insufficient documentation

## 2023-06-25 DIAGNOSIS — N898 Other specified noninflammatory disorders of vagina: Secondary | ICD-10-CM | POA: Diagnosis present

## 2023-06-25 NOTE — Progress Notes (Signed)
 Pt reports having history of vaginal odor 2 weeks ago when she scheduled this appointment however no odor today. She also reports having painful sex which began 2 months ago. This is a new problem. Pt desires testing for STI's also. Self swab was obtained. Pt requests to be called with test results as she has not been able to access her Mychart. Pt was instructed to schedule appointment with a provider if painful sex persists. She voiced understanding of information provided.

## 2023-06-26 LAB — CERVICOVAGINAL ANCILLARY ONLY
Bacterial Vaginitis (gardnerella): POSITIVE — AB
Candida Glabrata: NEGATIVE
Candida Vaginitis: POSITIVE — AB
Chlamydia: NEGATIVE
Comment: NEGATIVE
Comment: NEGATIVE
Comment: NEGATIVE
Comment: NEGATIVE
Comment: NEGATIVE
Comment: NORMAL
Neisseria Gonorrhea: NEGATIVE
Trichomonas: NEGATIVE

## 2023-07-01 ENCOUNTER — Telehealth: Payer: Self-pay | Admitting: Family Medicine

## 2023-07-01 NOTE — Telephone Encounter (Signed)
 Patient came in for self swab last week and still has not received a call with results.

## 2023-07-03 MED ORDER — METRONIDAZOLE 500 MG PO TABS: 500.0000 mg | ORAL_TABLET | Freq: Two times a day (BID) | ORAL | 0 refills | Status: DC

## 2023-07-03 MED ORDER — FLUCONAZOLE 150 MG PO TABS
150.0000 mg | ORAL_TABLET | Freq: Once | ORAL | 0 refills | Status: AC
Start: 2023-07-03 — End: 2023-07-03

## 2023-07-03 NOTE — Telephone Encounter (Signed)
 Per chart review, pt test result shows +BV and +yeast. I called pt and informed her of results and that I will send in the appropriate prescriptions - dose instructions discussed. Pt also stated that she has continued to have painful sex as discussed during nurse visit on 3/5 and would like visit w/provider. I advised that I will send a message to scheduling staff and she will be notified via Mychart.  Pt voiced understanding of all information and instructions given.

## 2023-08-29 ENCOUNTER — Ambulatory Visit: Admitting: Family Medicine

## 2023-10-13 ENCOUNTER — Ambulatory Visit: Admitting: Obstetrics and Gynecology

## 2023-10-13 IMAGING — US US OB TRANSVAGINAL
1 series · 16 of 28 positions shown · non-contrast
Comparison: none

[Series 1: us ob transvaginal · 49 acquisitions, 16 frames shown]
[im 1/49]
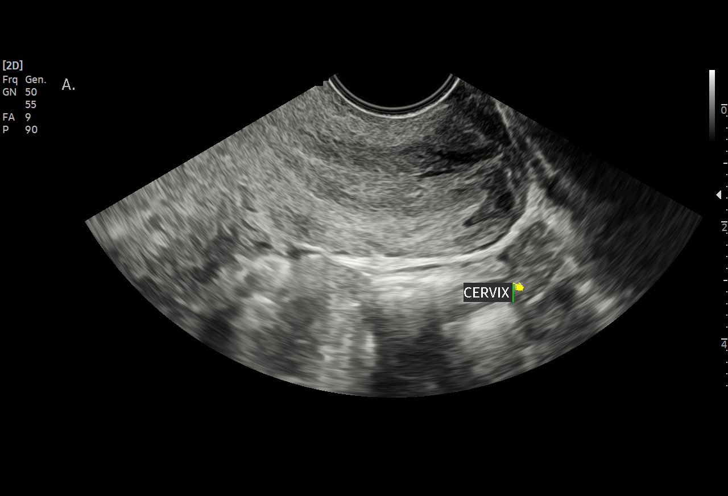
[im 4/49]
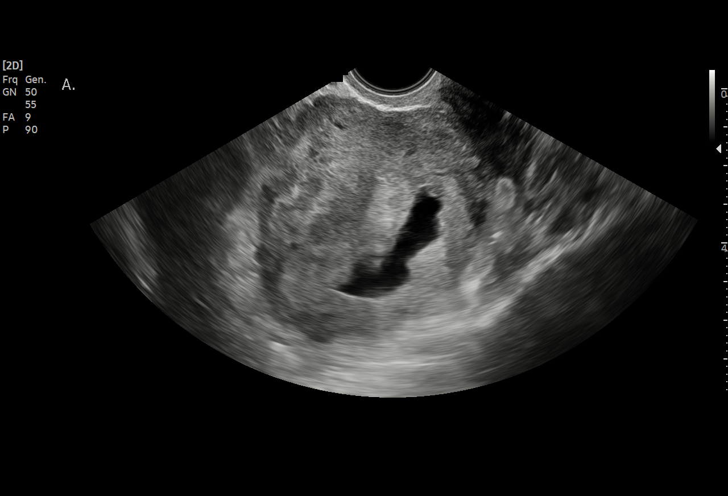
[im 8/49]
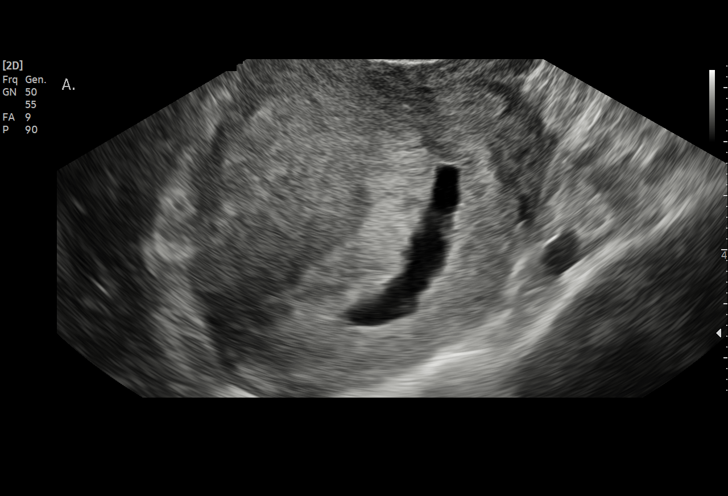
[im 11/49]
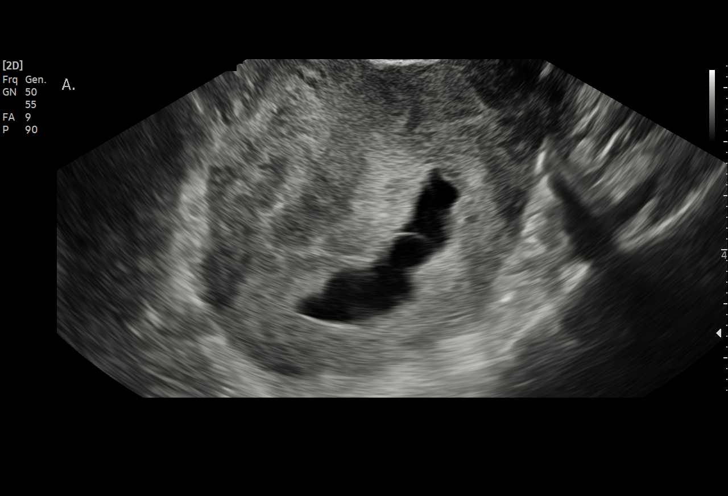
[im 13/49]
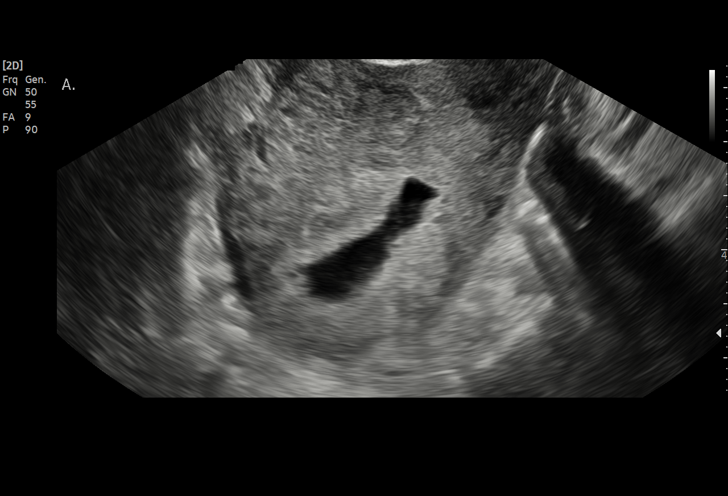
[im 17/49]
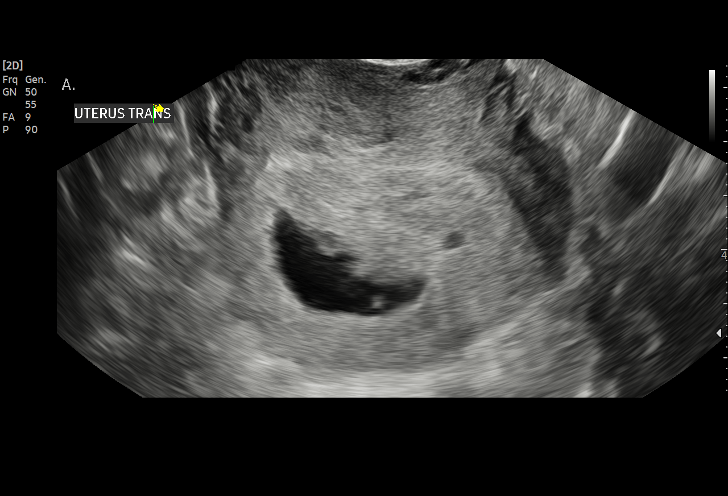
[im 20/49]
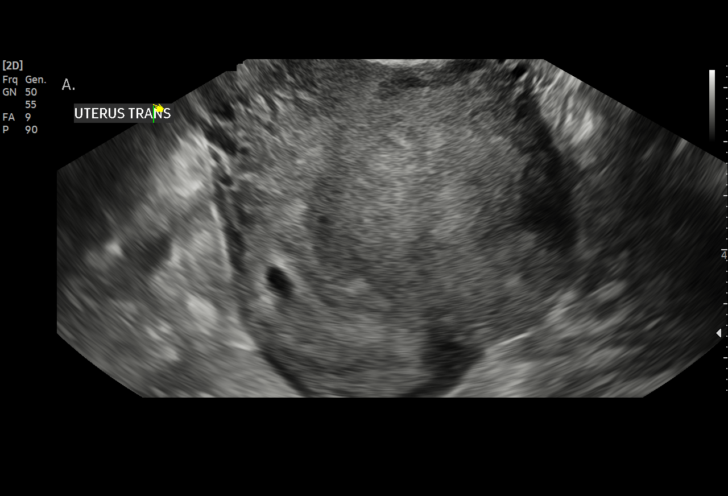
[im 24/49]
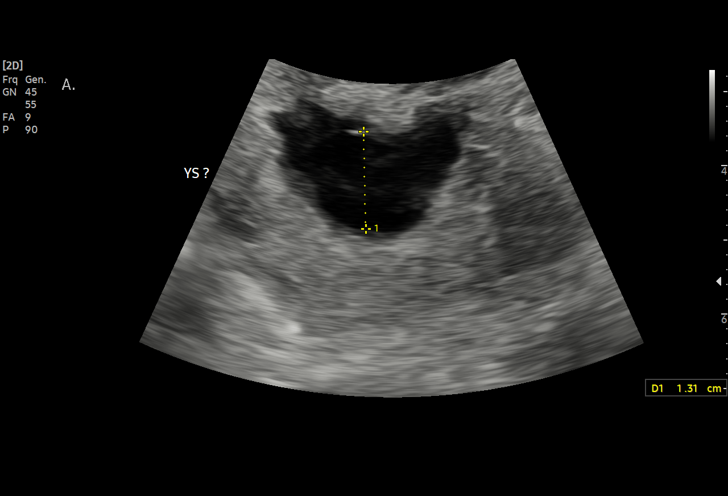
[im 25/49]
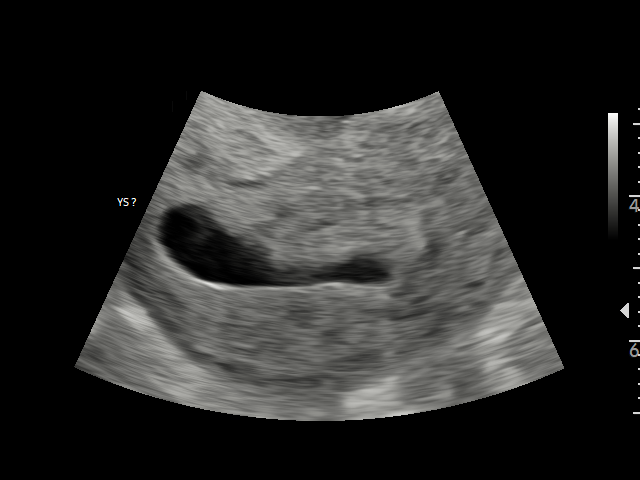
[im 29/49]
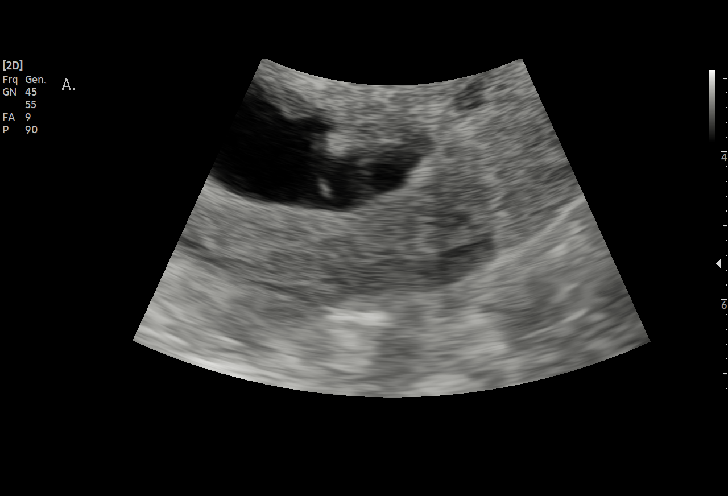
[im 33/49]
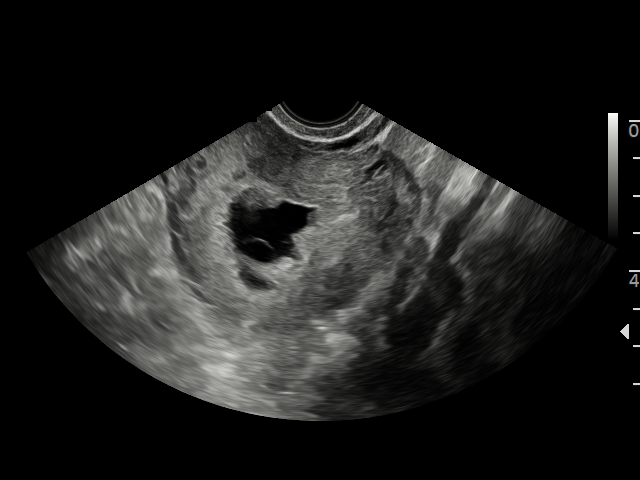
[im 36/49]
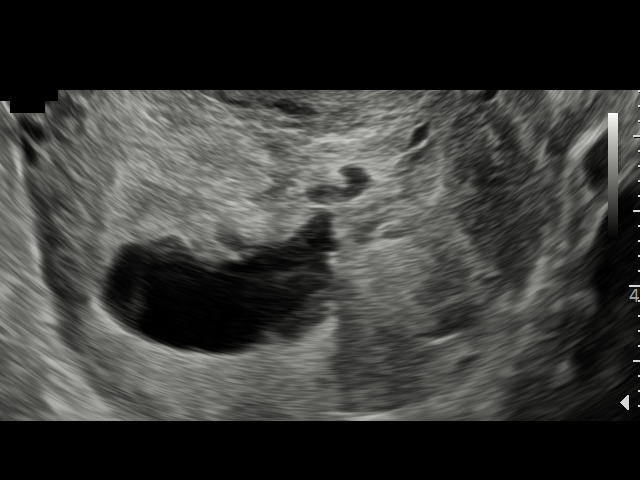
[im 38/49]
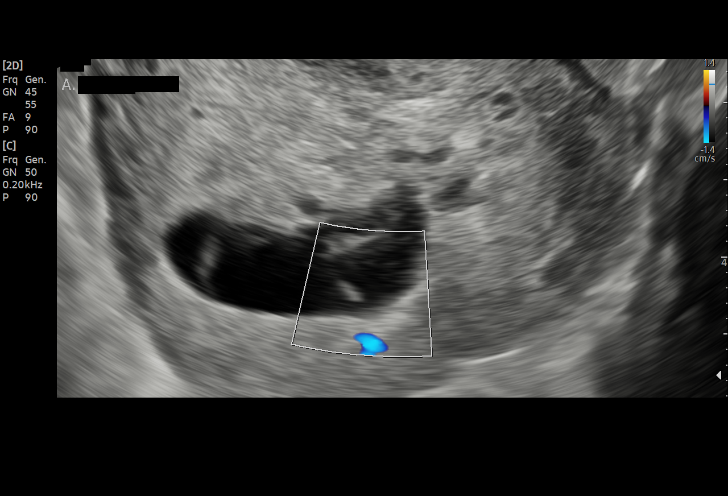
[im 41/49]
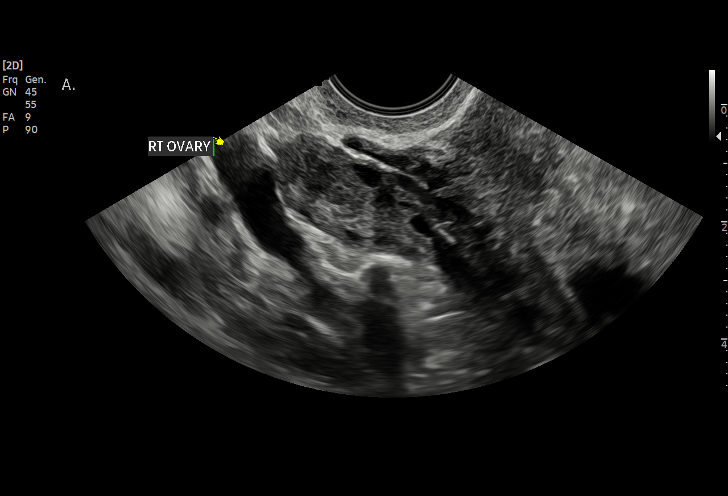
[im 45/49]
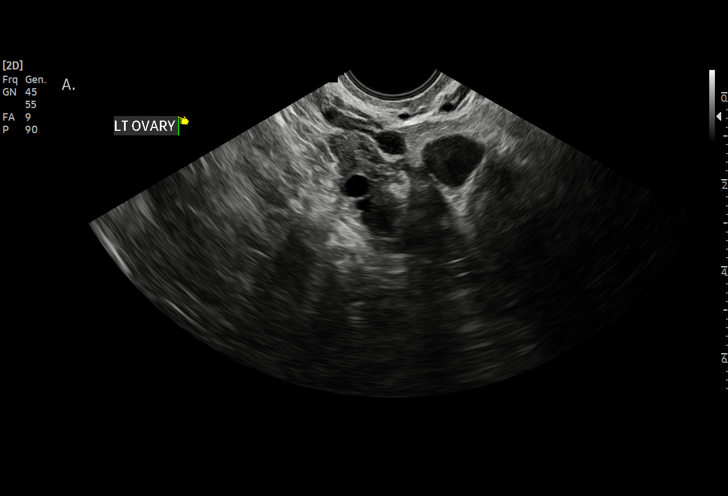
[im 49/49]
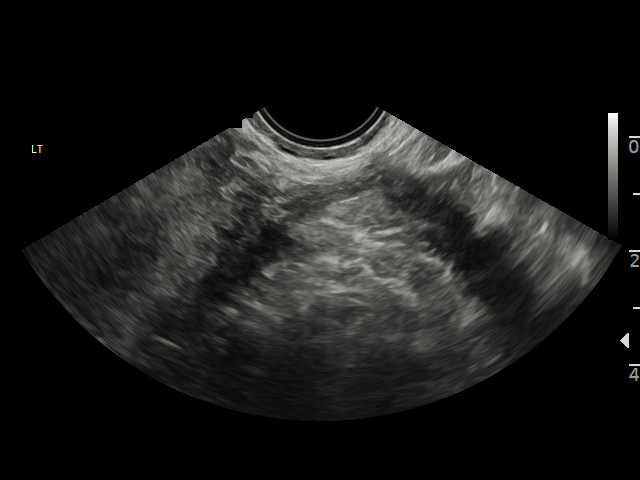

[16 of 28 positions shown; findings below may reference images not displayed]

Healthcare at

 1  US OB TRANSVAGINAL                    [address].0     [place] [facility]

Indications

 Pregnancy with inconclusive fetal viability
Fetal Evaluation

 Num Of Fetuses:         1
 Preg. Location:         Intrauterine
 Gest. Sac:              Irregular
 Yolk Sac:               Appears enlarged
 Fetal Pole:             Visualized
 Cardiac Activity:       Not visualized
Biometry

 CRL:       3.9  mm     G. Age:  6w 1d                   EDD:   03/25/22
Cervix Uterus Adnexa

 Cervix
 Closed

 Right Ovary
 Within normal limits.

 Left Ovary
 Within normal limits.
Impression
 Irregularly shaped IUGS with probable fetal pole measuring 6
 wk 1 day
 no FHR
 No progression since prior u/s
 Meets definitive criteria for failed pregnancy
Recommendations

 Follow-up as clinically indicated.
                 Borba, Stephani

## 2024-01-13 ENCOUNTER — Telehealth: Payer: Self-pay | Admitting: Family Medicine

## 2024-01-13 NOTE — Telephone Encounter (Signed)
 Patient is calling because she is having itching and burning around vaginal area. She said it hurts to the touch. I have offered patient multiple appts with a provider including today and tomorrow but patient is un able to come in at the times offered she is wanting a nurse to call back to see if there is anything she can do to really do before having to come in. It is marked that patient speaks spanish but she had the whole conversation in very good english.

## 2024-01-13 NOTE — Telephone Encounter (Signed)
 Called patient who reports having an IUD in place for 3 years and isn't sure if that is causing more infections or not. She has had redness, burning, & itching externally for the past 3 days on her labia with tenderness to touch. No discharge, odor, or lesions present. Asked patient if it feels similar to when she has had a yeast infection in the past and she states yes but this is on the outside. Recommended she try external monistat for now & to reach back out if things do not improve or worsen then she will need to come in for a provider visit. Patient verbalized understanding & would like appt for annual exam. Told patient I'll have someone from our front office reach out to her with an appt.

## 2024-03-03 ENCOUNTER — Other Ambulatory Visit: Payer: Self-pay

## 2024-03-03 ENCOUNTER — Other Ambulatory Visit (HOSPITAL_COMMUNITY)
Admission: RE | Admit: 2024-03-03 | Discharge: 2024-03-03 | Disposition: A | Discharge status: Home / Self Care | Source: Ambulatory Visit | Attending: Physician Assistant | Admitting: Physician Assistant

## 2024-03-03 ENCOUNTER — Ambulatory Visit (INDEPENDENT_AMBULATORY_CARE_PROVIDER_SITE_OTHER): Admitting: Physician Assistant

## 2024-03-03 VITALS — BP 101/68 | HR 93 | Wt 129.2 lb

## 2024-03-03 DIAGNOSIS — N898 Other specified noninflammatory disorders of vagina: Secondary | ICD-10-CM | POA: Diagnosis present

## 2024-03-03 NOTE — Progress Notes (Signed)
 GYNECOLOGY  VISIT   HPI: Robin Zhang is a 28 y.o.   Single  female  [id number] here for malodorous, white discharge that causes vaginal itching. She has had this on and off the entire time she has had a Liletta  IUD, and has been treated for recurrent BV.   GYNECOLOGIC HISTORY: No LMP recorded. (Menstrual status: IUD). Contraception: Liletta  IUD  Menopausal hormone therapy: premenopausal Last mammogram:  never done due to age Last pap smear:  Diagnosis  Date Value Ref Range Status  08/07/2021   Final   - Negative for intraepithelial lesion or malignancy (NILM)           OB History     Gravida  5   Para  2   Term  2   Preterm      AB  2   Living  2      SAB  2   IAB      Ectopic      Multiple      Live Births  2              Patient Active Problem List   Diagnosis Date Noted   Retained products of conception after delivery without hemorrhage 12/19/2020   Miscarriage 12/11/2020    No past medical history on file.  Past Surgical History:  Procedure Laterality Date   DILATION AND EVACUATION N/A 12/21/2020   Procedure: DILATATION AND EVACUATION;  Surgeon: Eveline Lynwood MATSU, MD;  Location: [facility];  Service: Gynecology;  Laterality: N/A;   DILATION AND EVACUATION N/A 08/07/2021   Procedure: DILATATION AND EVACUATION;  Surgeon: Izell Harari, MD;  Location: [facility] OR;  Service: Gynecology;  Laterality: N/A;   INTRAUTERINE DEVICE (IUD) INSERTION N/A 08/07/2021   Procedure: INTRAUTERINE DEVICE (IUD) INSERTION;  Surgeon: Izell Harari, MD;  Location: [facility] OR;  Service: Gynecology;  Laterality: N/A;    Current Outpatient Medications  Medication Sig Dispense Refill   levonorgestrel  (LILETTA , 52 MG,) 20.1 MCG/DAY IUD 1 each by Intrauterine route once.     No current facility-administered medications for this visit.     ALLERGIES: Patient has no known allergies.  Family History  Problem Relation Age of Onset   Healthy Mother    Healthy Father      Social History   Socioeconomic History   Marital status: Single    Spouse name: Not on file   Number of children: Not on file   Years of education: Not on file   Highest education level: Not on file  Occupational History   Not on file  Tobacco Use   Smoking status: Never   Smokeless tobacco: Never  Vaping Use   Vaping status: Never Used  Substance and Sexual Activity   Alcohol use: Not Currently   Drug use: Never   Sexual activity: Yes    Birth control/protection: None    Comment: approx [redacted] wks gestation  Other Topics Concern   Not on file  Social History Narrative   Not on file   Social Drivers of Health   Financial Resource Strain: Not on file  Food Insecurity: No Food Insecurity (12/19/2020)   Hunger Vital Sign    Worried About Running Out of Food in the Last Year: Never true    Ran Out of Food in the Last Year: Never true  Transportation Needs: No Transportation Needs (12/19/2020)   PRAPARE - Administrator, Civil Service (Medical): No    Lack of Transportation (Non-Medical):  No  Physical Activity: Not on file  Stress: Not on file  Social Connections: Not on file  Intimate Partner Violence: Not on file    Review of Systems  PHYSICAL EXAMINATION:    BP 101/68   Pulse 93   Wt 129 lb 3 oz (58.6 kg)   Breastfeeding No   BMI 22.88 kg/m     General appearance: alert, cooperative and appears stated age Head: Normocephalic, without obvious abnormality, atraumatic Lungs: normal respiratory effort Neurologic: Grossly normal  ASSESSMENT & PLAN   1. 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 We discussed behavioral modifications to prevent recurrence of BV, as well as the extended regimen for treatment. Patient opts for this if swab returns with BV.  - Cervicovaginal ancillary only   An After Visit Summary was printed and given to the patient.  Nubia Ziesmer E Antwyne Pingree,  PA-C 11/12/202511:12 AM

## 2024-03-05 LAB — CERVICOVAGINAL ANCILLARY ONLY
Bacterial Vaginitis (gardnerella): POSITIVE — AB
Candida Glabrata: NEGATIVE
Candida Vaginitis: NEGATIVE
Chlamydia: NEGATIVE
Comment: NEGATIVE
Comment: NEGATIVE
Comment: NEGATIVE
Comment: NEGATIVE
Comment: NEGATIVE
Comment: NORMAL
Neisseria Gonorrhea: NEGATIVE
Trichomonas: NEGATIVE

## 2024-03-07 ENCOUNTER — Other Ambulatory Visit: Payer: Self-pay | Admitting: Physician Assistant

## 2024-03-07 ENCOUNTER — Ambulatory Visit: Payer: Self-pay | Admitting: Physician Assistant

## 2024-03-07 DIAGNOSIS — B9689 Other specified bacterial agents as the cause of diseases classified elsewhere: Secondary | ICD-10-CM

## 2024-03-07 MED ORDER — METRONIDAZOLE 0.75 % VA GEL: 1.0000 | VAGINAL | 2 refills | Status: DC

## 2024-03-17 ENCOUNTER — Telehealth: Payer: Self-pay

## 2024-03-17 DIAGNOSIS — B9689 Other specified bacterial agents as the cause of diseases classified elsewhere: Secondary | ICD-10-CM

## 2024-03-17 MED ORDER — METRONIDAZOLE 0.75 % VA GEL: 1.0000 | VAGINAL | 2 refills | Status: AC

## 2024-03-17 NOTE — Telephone Encounter (Signed)
 Pt left voicemail stating she never received medication for BV.     Waddell, RN

## 2024-03-17 NOTE — Telephone Encounter (Signed)
 Advised pt that prescription was sent to [facility] pharmacy on [address]  She verbalized understanding and had no further questions at this time.    Waddell, RN

## 2024-04-20 ENCOUNTER — Telehealth: Payer: Self-pay | Admitting: Family Medicine

## 2024-04-20 NOTE — Telephone Encounter (Signed)
 Patient called to say she has been experiencing some itching and pain inside. She states she pees a little, and all the time, but just a little comes out. Informed her I could schedule her for a nurse visit, but would also send message to clinical staff.

## 2024-04-21 NOTE — Telephone Encounter (Signed)
 Called pt with Spanish Interpreter Eda R., and pt informs me that she has burning with urination and she has push to urinate.  I advised pt that she will need to go to Urgent Care today as she is stating that she is having pain.  Pt also has a c/o recurrent BV infection since getting her IUD replaced.  I advised pt to to please schedule an appt with a provider to discuss her concerns.  Pt verbalized understanding.    Troy Hartzog,RN  04/21/24

## 2024-04-28 ENCOUNTER — Ambulatory Visit: Payer: Self-pay

## 2024-05-31 ENCOUNTER — Ambulatory Visit: Payer: Self-pay | Admitting: Obstetrics and Gynecology
